# Patient Record
Sex: Female | Born: 1970 | Race: Asian | Hispanic: No | Marital: Married | Smoking: Never smoker
Health system: Southern US, Community
[De-identification: ages and names within clinical notes are randomized; demographics above are authoritative.]

---

## 2013-11-16 ENCOUNTER — Ambulatory Visit: Payer: Self-pay | Admitting: Internal Medicine

## 2014-06-26 ENCOUNTER — Emergency Department: Payer: Self-pay | Admitting: Emergency Medicine

## 2014-06-26 LAB — CBC WITH DIFFERENTIAL/PLATELET
BASOS ABS: 0 10*3/uL (ref 0.0–0.1)
Basophil %: 0.3 %
EOS PCT: 0.3 %
Eosinophil #: 0 10*3/uL (ref 0.0–0.7)
HCT: 43.6 % (ref 35.0–47.0)
HGB: 14.2 g/dL (ref 12.0–16.0)
Lymphocyte #: 1 10*3/uL (ref 1.0–3.6)
Lymphocyte %: 7.7 %
MCH: 30.6 pg (ref 26.0–34.0)
MCHC: 32.6 g/dL (ref 32.0–36.0)
MCV: 94 fL (ref 80–100)
Monocyte #: 0.7 x10 3/mm (ref 0.2–0.9)
Monocyte %: 5 %
NEUTROS ABS: 11.5 10*3/uL — AB (ref 1.4–6.5)
NEUTROS PCT: 86.7 %
PLATELETS: 283 10*3/uL (ref 150–440)
RBC: 4.64 10*6/uL (ref 3.80–5.20)
RDW: 13.8 % (ref 11.5–14.5)
WBC: 13.3 10*3/uL — AB (ref 3.6–11.0)

## 2014-06-26 LAB — COMPREHENSIVE METABOLIC PANEL
Albumin: 3.2 g/dL — ABNORMAL LOW (ref 3.4–5.0)
Alkaline Phosphatase: 55 U/L
Anion Gap: 9 (ref 7–16)
BILIRUBIN TOTAL: 0.9 mg/dL (ref 0.2–1.0)
BUN: 7 mg/dL (ref 7–18)
CO2: 24 mmol/L (ref 21–32)
CREATININE: 0.55 mg/dL — AB (ref 0.60–1.30)
Calcium, Total: 8.5 mg/dL (ref 8.5–10.1)
Chloride: 103 mmol/L (ref 98–107)
EGFR (Non-African Amer.): >60
Glucose: 105 mg/dL — ABNORMAL HIGH (ref 65–99)
OSMOLALITY: 270 (ref 275–301)
Potassium: 4.2 mmol/L (ref 3.5–5.1)
SGOT(AST): 37 U/L (ref 15–37)
SGPT (ALT): 22 U/L
Sodium: 136 mmol/L (ref 136–145)
Total Protein: 7.8 g/dL (ref 6.4–8.2)

## 2014-06-26 LAB — URINALYSIS, COMPLETE
Bilirubin,UR: NEGATIVE
Glucose,UR: NEGATIVE mg/dL (ref 0–75)
LEUKOCYTE ESTERASE: NEGATIVE
NITRITE: NEGATIVE
PH: 7 (ref 4.5–8.0)
Protein: NEGATIVE
RBC,UR: 1 /HPF (ref 0–5)
SPECIFIC GRAVITY: 1.004 (ref 1.003–1.030)
Squamous Epithelial: <1
WBC UR: 3 /HPF (ref 0–5)

## 2014-06-26 LAB — LIPASE, BLOOD: Lipase: 66 U/L — ABNORMAL LOW (ref 73–393)

## 2014-11-17 ENCOUNTER — Ambulatory Visit: Payer: Self-pay | Admitting: Internal Medicine

## 2016-05-22 ENCOUNTER — Other Ambulatory Visit: Payer: Self-pay | Admitting: Internal Medicine

## 2016-05-22 DIAGNOSIS — Z1231 Encounter for screening mammogram for malignant neoplasm of breast: Secondary | ICD-10-CM

## 2016-05-22 DIAGNOSIS — E559 Vitamin D deficiency, unspecified: Secondary | ICD-10-CM | POA: Insufficient documentation

## 2016-05-22 DIAGNOSIS — E039 Hypothyroidism, unspecified: Secondary | ICD-10-CM | POA: Insufficient documentation

## 2016-07-05 DIAGNOSIS — M722 Plantar fascial fibromatosis: Secondary | ICD-10-CM | POA: Insufficient documentation

## 2016-07-05 DIAGNOSIS — E782 Mixed hyperlipidemia: Secondary | ICD-10-CM | POA: Insufficient documentation

## 2016-08-13 ENCOUNTER — Encounter: Payer: Self-pay | Admitting: Radiology

## 2016-08-13 ENCOUNTER — Ambulatory Visit
Admission: RE | Admit: 2016-08-13 | Discharge: 2016-08-13 | Disposition: A | Discharge status: Home / Self Care | Payer: BLUE CROSS/BLUE SHIELD | Source: Ambulatory Visit | Attending: Internal Medicine | Admitting: Internal Medicine

## 2016-08-13 DIAGNOSIS — Z1231 Encounter for screening mammogram for malignant neoplasm of breast: Secondary | ICD-10-CM | POA: Insufficient documentation

## 2017-02-04 ENCOUNTER — Other Ambulatory Visit: Payer: Self-pay

## 2017-02-04 DIAGNOSIS — E782 Mixed hyperlipidemia: Secondary | ICD-10-CM

## 2017-02-04 DIAGNOSIS — E039 Hypothyroidism, unspecified: Secondary | ICD-10-CM

## 2017-02-05 LAB — LIPID PANEL
Chol/HDL Ratio: 5.2 ratio — ABNORMAL HIGH (ref 0.0–4.4)
Cholesterol, Total: 219 mg/dL — ABNORMAL HIGH (ref 100–199)
HDL: 42 mg/dL (ref >39)
LDL Calculated: 127 mg/dL — ABNORMAL HIGH (ref 0–99)
TRIGLYCERIDES: 248 mg/dL — AB (ref 0–149)
VLDL Cholesterol Cal: 50 mg/dL — ABNORMAL HIGH (ref 5–40)

## 2017-02-05 LAB — TSH: TSH: 5.08 u[IU]/mL — ABNORMAL HIGH (ref 0.450–4.500)

## 2017-02-08 DIAGNOSIS — R7989 Other specified abnormal findings of blood chemistry: Secondary | ICD-10-CM | POA: Insufficient documentation

## 2017-05-06 ENCOUNTER — Other Ambulatory Visit: Payer: Self-pay | Admitting: Internal Medicine

## 2017-05-06 DIAGNOSIS — Z1231 Encounter for screening mammogram for malignant neoplasm of breast: Secondary | ICD-10-CM

## 2017-08-07 ENCOUNTER — Other Ambulatory Visit: Payer: Self-pay

## 2017-08-07 DIAGNOSIS — E782 Mixed hyperlipidemia: Secondary | ICD-10-CM

## 2017-08-07 DIAGNOSIS — R7989 Other specified abnormal findings of blood chemistry: Secondary | ICD-10-CM

## 2017-08-07 DIAGNOSIS — E559 Vitamin D deficiency, unspecified: Secondary | ICD-10-CM

## 2017-08-08 LAB — LIPID PANEL
CHOLESTEROL TOTAL: 248 mg/dL — AB (ref 100–199)
Chol/HDL Ratio: 5.2 ratio — ABNORMAL HIGH (ref 0.0–4.4)
HDL: 48 mg/dL (ref >39)
LDL CALC: 162 mg/dL — AB (ref 0–99)
TRIGLYCERIDES: 189 mg/dL — AB (ref 0–149)
VLDL Cholesterol Cal: 38 mg/dL (ref 5–40)

## 2017-08-08 LAB — TSH: TSH: 3.45 u[IU]/mL (ref 0.450–4.500)

## 2017-08-08 LAB — VITAMIN D 25 HYDROXY (VIT D DEFICIENCY, FRACTURES): VIT D 25 HYDROXY: 20 ng/mL — AB (ref 30.0–100.0)

## 2017-08-14 ENCOUNTER — Ambulatory Visit
Admission: RE | Admit: 2017-08-14 | Discharge: 2017-08-14 | Disposition: A | Discharge status: Home / Self Care | Payer: BLUE CROSS/BLUE SHIELD | Source: Ambulatory Visit | Attending: Internal Medicine | Admitting: Internal Medicine

## 2017-08-14 DIAGNOSIS — Z1231 Encounter for screening mammogram for malignant neoplasm of breast: Secondary | ICD-10-CM | POA: Insufficient documentation

## 2018-01-13 ENCOUNTER — Telehealth: Payer: Self-pay | Admitting: *Deleted

## 2018-01-13 ENCOUNTER — Ambulatory Visit: Payer: Self-pay | Admitting: Medical

## 2018-01-13 VITALS — BP 128/60 | HR 115 | Temp 103.1°F | Resp 18 | Ht 60.0 in | Wt 146.8 lb

## 2018-01-13 DIAGNOSIS — J111 Influenza due to unidentified influenza virus with other respiratory manifestations: Secondary | ICD-10-CM

## 2018-01-13 DIAGNOSIS — J069 Acute upper respiratory infection, unspecified: Secondary | ICD-10-CM

## 2018-01-13 DIAGNOSIS — R509 Fever, unspecified: Secondary | ICD-10-CM

## 2018-01-13 LAB — POCT INFLUENZA A/B
INFLUENZA A, POC: NEGATIVE
INFLUENZA B, POC: NEGATIVE

## 2018-01-13 MED ORDER — AZITHROMYCIN 250 MG PO TABS: ORAL_TABLET | ORAL | 0 refills | Status: DC

## 2018-01-13 MED ORDER — OSELTAMIVIR PHOSPHATE 75 MG PO CAPS: 75.0000 mg | ORAL_CAPSULE | Freq: Two times a day (BID) | ORAL | 0 refills | Status: DC

## 2018-01-13 NOTE — Progress Notes (Signed)
   Subjective:    Patient ID: Kristine Flynn, female    DOB: April 25, 1971, 47 y.o.   MRN: [id number]  HPI 47 yo female presents in non acute distress with fever and not feeling well.   Started Friday  Mild sore throat, cough with  phelgm that is yelllow cough. And Body aches.  Has gargled a couple of times today which helped the throat.No shortness of breath or chest pain.  Blood pressure 128/60, pulse (!) 115, temperature (!) 103.1 F (39.5 C), temperature source Tympanic, resp. rate 18, height 5' (1.524 m), weight 146 lb 12.8 oz (66.6 kg), SpO2 98 %.  Review of Systems  Constitutional: Positive for appetite change (decreased appetite), fatigue and fever. Negative for chills.  HENT: Positive for congestion (mild), postnasal drip and sore throat. Negative for ear discharge, ear pain, sinus pressure, sinus pain and sneezing.   Respiratory: Positive for cough. Negative for shortness of breath and wheezing.   Cardiovascular: Negative for chest pain.  Gastrointestinal: Negative for abdominal pain.  Genitourinary: Negative for dysuria.  Musculoskeletal: Negative for myalgias.  Skin: Negative for rash.  Allergic/Immunologic: Negative for environmental allergies.       Objective:   Physical Exam  Constitutional: She appears well-developed and well-nourished.  HENT:  Head: Normocephalic and atraumatic.  Right Ear: External ear normal.  Eyes: Conjunctivae and EOM are normal. Pupils are equal, round, and reactive to light.  Neck: Normal range of motion. Neck supple.  Cardiovascular: Regular rhythm and normal heart sounds. Tachycardia present.  Pulmonary/Chest: Effort normal and breath sounds normal.  Musculoskeletal: Normal range of motion.  Neurological: She is alert.  Skin: Skin is warm, dry and intact.  Psychiatric: She has a normal mood and affect. Her speech is normal and behavior is normal. Judgment and thought content normal. Cognition and memory are normal.  Nursing note and vitals  reviewed.  Patient looks ill and tired. Given Tylenol 1 gram po [id number] exp 4/22 Inflenza test negative for A and B 102.3 at discharge patient feeling a bit better.    Assessment & Plan:  Ifluenza like Illness Upper Respiratory Infection. Meds ordered this encounter  Medications  . azithromycin (ZITHROMAX) 250 MG tablet    Sig: Take  2 tablets by mouth today then one tablet days 2-5 , take with food.    Dispense:  6 tablet    Refill:  0  . oseltamivir (TAMIFLU) 75 MG capsule    Sig: Take 1 capsule (75 mg total) by mouth 2 (two) times daily.    Dispense:  10 capsule    Refill:  0  Rest , increase fluids.water. Monitor and treat fever with OTC Motrin or Tylenol take as directed. Next dose of Tylenol 8pm Alternate Motrin and Tylenol for high fever. Return  To clinic in  3-5 days if not improving.  Recommended OTC Delsym or Robitussion , take as directed for cough. Patient verbalizes understanding an has no questions at discharge.

## 2018-01-13 NOTE — Patient Instructions (Signed)
Upper Respiratory Infection, Adult Most upper respiratory infections (URIs) are caused by a virus. A URI affects the nose, throat, and upper air passages. The most common type of URI is often called "the common cold." Follow these instructions at home:  Take medicines only as told by your doctor.  Gargle warm saltwater or take cough drops to comfort your throat as told by your doctor.  Use a warm mist humidifier or inhale steam from a shower to increase air moisture. This may make it easier to breathe.  Drink enough fluid to keep your pee (urine) clear or pale yellow.  Eat soups and other clear broths.  Have a healthy diet.  Rest as needed.  Go back to work when your fever is gone or your doctor says it is okay. ? You may need to stay home longer to avoid giving your URI to others. ? You can also wear a face mask and wash your hands often to prevent spread of the virus.  Use your inhaler more if you have asthma.  Do not use any tobacco products, including cigarettes, chewing tobacco, or electronic cigarettes. If you need help quitting, ask your doctor. Contact a doctor if:  You are getting worse, not better.  Your symptoms are not helped by medicine.  You have chills.  You are getting more short of breath.  You have brown or red mucus.  You have yellow or brown discharge from your nose.  You have pain in your face, especially when you bend forward.  You have a fever.  You have puffy (swollen) neck glands.  You have pain while swallowing.  You have white areas in the back of your throat. Get help right away if:  You have very bad or constant: ? Headache. ? Ear pain. ? Pain in your forehead, behind your eyes, and over your cheekbones (sinus pain). ? Chest pain.  You have long-lasting (chronic) lung disease and any of the following: ? Wheezing. ? Long-lasting cough. ? Coughing up blood. ? A change in your usual mucus.  You have a stiff neck.  You have  changes in your: ? Vision. ? Hearing. ? Thinking. ? Mood. This information is not intended to replace advice given to you by your health care provider. Make sure you discuss any questions you have with your health care provider. Document Released: 04/02/2008 Document Revised: 06/17/2016 Document Reviewed: 01/20/2014 Elsevier Interactive Patient Education  2018 [employer] Influenza, Adult Influenza ("the flu") is an infection in the lungs, nose, and throat (respiratory tract). It is caused by a virus. The flu causes many common cold symptoms, as well as a high fever and body aches. It can make you feel very sick. The flu spreads easily from person to person (is contagious). Getting a flu shot (influenza vaccination) every year is the best way to prevent the flu. Follow these instructions at home:  Take over-the-counter and prescription medicines only as told by your doctor.  Use a cool mist humidifier to add moisture (humidity) to the air in your home. This can make it easier to breathe.  Rest as needed.  Drink enough fluid to keep your pee (urine) clear or pale yellow.  Cover your mouth and nose when you cough or sneeze.  Wash your hands with soap and water often, especially after you cough or sneeze. If you cannot use soap and water, use hand sanitizer.  Stay home from work or school as told by your doctor. Unless you are visiting your  doctor, try to avoid leaving home until your fever has been gone for 24 hours without the use of medicine.  Keep all follow-up visits as told by your doctor. This is important. How is this prevented?  Getting a yearly (annual) flu shot is the best way to avoid getting the flu. You may get the flu shot in late summer, fall, or winter. Ask your doctor when you should get your flu shot.  Wash your hands often or use hand sanitizer often.  Avoid contact with people who are sick during cold and flu season.  Eat healthy foods.  Drink plenty of  fluids.  Get enough sleep.  Exercise regularly. Contact a doctor if:  You get new symptoms.  You have: ? Chest pain. ? Watery poop (diarrhea). ? A fever.  Your cough gets worse.  You start to have more mucus.  You feel sick to your stomach (nauseous).  You throw up (vomit). Get help right away if:  You start to be short of breath or have trouble breathing.  Your skin or nails turn a bluish color.  You have very bad pain or stiffness in your neck.  You get a sudden headache.  You get sudden pain in your face or ear.  You cannot stop throwing up. This information is not intended to replace advice given to you by your health care provider. Make sure you discuss any questions you have with your health care provider. Document Released: 07/24/2008 Document Revised: 03/22/2016 Document Reviewed: 08/09/2015 Elsevier Interactive Patient Education  2017 [employer]

## 2018-01-16 ENCOUNTER — Telehealth: Payer: Self-pay

## 2018-01-16 NOTE — Telephone Encounter (Signed)
Kristine Flynn Bibleat called the clinic because her temp had dropped to 97.5 which is below the normal of 98.7.  Reassured her that this is fine and is probably due to the tylenol that she has been taking her fever which has been as high as 103. Marland Kitchen. She has now stopped the Tylenol since she no longer has a fever.  Patient states she is feeling better and will call the clinic with any further concerns.

## 2018-01-17 ENCOUNTER — Ambulatory Visit
Admission: RE | Admit: 2018-01-17 | Discharge: 2018-01-17 | Disposition: A | Discharge status: Home / Self Care | Payer: BLUE CROSS/BLUE SHIELD | Source: Ambulatory Visit | Attending: Adult Health | Admitting: Adult Health

## 2018-01-17 ENCOUNTER — Telehealth: Payer: Self-pay | Admitting: Adult Health

## 2018-01-17 ENCOUNTER — Ambulatory Visit: Payer: Self-pay | Admitting: Adult Health

## 2018-01-17 ENCOUNTER — Encounter: Payer: Self-pay | Admitting: Adult Health

## 2018-01-17 VITALS — BP 128/66 | HR 84 | Temp 98.3°F | Resp 16 | Ht 60.0 in | Wt 145.0 lb

## 2018-01-17 DIAGNOSIS — J189 Pneumonia, unspecified organism: Secondary | ICD-10-CM

## 2018-01-17 DIAGNOSIS — R918 Other nonspecific abnormal finding of lung field: Secondary | ICD-10-CM | POA: Insufficient documentation

## 2018-01-17 DIAGNOSIS — R5383 Other fatigue: Secondary | ICD-10-CM | POA: Diagnosis present

## 2018-01-17 DIAGNOSIS — R059 Cough, unspecified: Secondary | ICD-10-CM

## 2018-01-17 DIAGNOSIS — R05 Cough: Secondary | ICD-10-CM

## 2018-01-17 MED ORDER — AMOXICILLIN-POT CLAVULANATE 875-125 MG PO TABS: 1.0000 | ORAL_TABLET | Freq: Two times a day (BID) | ORAL | 0 refills | Status: DC

## 2018-01-17 MED ORDER — AMOXICILLIN-POT CLAVULANATE ER 1000-62.5 MG PO TB12: 2.0000 | ORAL_TABLET | Freq: Two times a day (BID) | ORAL | 0 refills | Status: DC

## 2018-01-17 NOTE — Addendum Note (Signed)
Addended by: Berniece PapFLINCHUM, Lillee Mooneyhan S on: 01/17/2018 05:55 PM   Modules accepted: Orders

## 2018-01-17 NOTE — Patient Instructions (Signed)
Cough, Adult A cough helps to clear your throat and lungs. A cough may last only 2-3 weeks (acute), or it may last longer than 8 weeks (chronic). Many different things can cause a cough. A cough may be a sign of an illness or another medical condition. Follow these instructions at home:  Pay attention to any changes in your cough.  Take medicines only as told by your doctor. ? If you were prescribed an antibiotic medicine, take it as told by your doctor. Do not stop taking it even if you start to feel better. ? Talk with your doctor before you try using a cough medicine.  Drink enough fluid to keep your pee (urine) clear or pale yellow.  If the air is dry, use a cold steam vaporizer or humidifier in your home.  Stay away from things that make you cough at work or at home.  If your cough is worse at night, try using extra pillows to raise your head up higher while you sleep.  Do not smoke, and try not to be around smoke. If you need help quitting, ask your doctor.  Do not have caffeine.  Do not drink alcohol.  Rest as needed. Contact a doctor if:  You have new problems (symptoms).  You cough up yellow fluid (pus).  Your cough does not get better after 2-3 weeks, or your cough gets worse.  Medicine does not help your cough and you are not sleeping well.  You have pain that gets worse or pain that is not helped with medicine.  You have a fever.  You are losing weight and you do not know why.  You have night sweats. Get help right away if:  You cough up blood.  You have trouble breathing.  Your heartbeat is very fast. This information is not intended to replace advice given to you by your health care provider. Make sure you discuss any questions you have with your health care provider. Document Released: 06/28/2011 Document Revised: 03/22/2016 Document Reviewed: 12/22/2014 Elsevier Interactive Patient Education  2018 [employer] Influenza, Adult Influenza ("the  flu") is an infection in the lungs, nose, and throat (respiratory tract). It is caused by a virus. The flu causes many common cold symptoms, as well as a high fever and body aches. It can make you feel very sick. The flu spreads easily from person to person (is contagious). Getting a flu shot (influenza vaccination) every year is the best way to prevent the flu. Follow these instructions at home:  Take over-the-counter and prescription medicines only as told by your doctor.  Use a cool mist humidifier to add moisture (humidity) to the air in your home. This can make it easier to breathe.  Rest as needed.  Drink enough fluid to keep your pee (urine) clear or pale yellow.  Cover your mouth and nose when you cough or sneeze.  Wash your hands with soap and water often, especially after you cough or sneeze. If you cannot use soap and water, use hand sanitizer.  Stay home from work or school as told by your doctor. Unless you are visiting your doctor, try to avoid leaving home until your fever has been gone for 24 hours without the use of medicine.  Keep all follow-up visits as told by your doctor. This is important. How is this prevented?  Getting a yearly (annual) flu shot is the best way to avoid getting the flu. You may get the flu shot in late summer, fall,  or winter. Ask your doctor when you should get your flu shot.  Wash your hands often or use hand sanitizer often.  Avoid contact with people who are sick during cold and flu season.  Eat healthy foods.  Drink plenty of fluids.  Get enough sleep.  Exercise regularly. Contact a doctor if:  You get new symptoms.  You have: ? Chest pain. ? Watery poop (diarrhea). ? A fever.  Your cough gets worse.  You start to have more mucus.  You feel sick to your stomach (nauseous).  You throw up (vomit). Get help right away if:  You start to be short of breath or have trouble breathing.  Your skin or nails turn a bluish  color.  You have very bad pain or stiffness in your neck.  You get a sudden headache.  You get sudden pain in your face or ear.  You cannot stop throwing up. This information is not intended to replace advice given to you by your health care provider. Make sure you discuss any questions you have with your health care provider. Document Released: 07/24/2008 Document Revised: 03/22/2016 Document Reviewed: 08/09/2015 Elsevier Interactive Patient Education  2017 [employer].

## 2018-01-17 NOTE — Telephone Encounter (Addendum)
Patient called and reviewed CXR results below- given symptoms and clinical exam will treat as likely infection- Discussed results with supervising and Levaquin or Augmentin suggested at this time. Discontinue Azithromycin. She prefers Augmentin over Levaquin due to black box warning of tendon rupture. Discussed other possible differentials and waring signs to return to the emergency room or Call 911 if any symptoms change or worsen.   RDW elevated and she will follow up with PCP for this. CBC And CMET Stat otherwise normal.   Meds ordered this encounter  Medications  . DISCONTD: amoxicillin-clavulanate (AUGMENTIN) 875-125 MG tablet    Sig: Take 1 tablet by mouth 2 (two) times daily.    Dispense:  20 tablet    Refill:  0  . amoxicillin-clavulanate (AUGMENTIN XR) 1000-62.5 MG 12 hr tablet    Sig: Take 2 tablets by mouth 2 (two) times daily.    Dispense:  20 tablet    Refill:  0    After discussing with Dr. Sullivan Lone will treat with High dose Augmentin x 5 days. Also discussed with Kathlene November at [facility] [address] and verified high dose Augmentin per guidelines as above.   Orders Placed This Encounter  Procedures  . DG Chest 2 View    Standing Status:   Future    Standing Expiration Date:   03/19/2018    Order Specific Question:   Reason for Exam (SYMPTOM  OR DIAGNOSIS REQUIRED)    Answer:   follow up possibly ateletasis/ early infection/ treated as pneumonia    Order Specific Question:   Is patient pregnant?    Answer:   Unknown (Please Explain)    Comments:   please question patients     Order Specific Question:   Preferred imaging location?    Answer:   [facility]    Order Specific Question:   Call Results- Best Contact Number?    Answer:   [phone]    Order Specific Question:   Radiology Contrast Protocol - do NOT remove file path    Answer:   [email or web address]        DG Chest 2 View [[id number]] Resulted: 01/17/18 1611   Order Status: Completed Updated: 01/17/18 1613  Narrative:   CLINICAL DATA: Fluid 1 week ago on antibiotics with persistent productive cough and fatigue.  EXAM: CHEST - 2 VIEW  COMPARISON: None.  FINDINGS: Lungs are adequately inflated with possible mild opacification over the region of the right middle lobe/lingula on the lateral film. No effusion. Cardiomediastinal silhouette and remainder of the exam is within normal.  IMPRESSION: Mild hazy airspace density in the region of the right middle lobe/lingula on the lateral film which may be due to atelectasis or early infection.   Electronically Signed By: Elberta Fortis M.D. On: 01/17/2018 16:11   Advised patient call the office or your primary care doctor for an appointment if no improvement within 72 hours or if any symptoms change or worsen at any time  Advised ER or urgent Care if after hours or on weekend. Call 911 for emergency symptoms at any time.Patinet verbalized understanding of all instructions given/reviewed and treatment plan and has no further questions or concerns at this time.    She will call the office on Monday 01/20/18 for office visit on Monday or Tuesday for recheck and knows to go for repeat chest x ray in one month for follow up - walk in at [facility] and order has been placed already   Provider thoroughly discussed in collaboration above  plan with supervising physician Dr. Julieanne Mansonichard Gilbert who is in agreement with the care plan as above.   Results for orders placed or performed in visit on 01/17/18 (from the past 24 hour(s))  CBC w/Diff     Status: Abnormal   Collection Time: 01/17/18  2:58 PM  Result Value Ref Range   WBC 5.3 3.4 - 10.8 x10E3/uL   RBC 4.87 3.77 - 5.28 x10E6/uL   Hemoglobin 13.2 11.1 - 15.9 g/dL   Hematocrit 16.139.7 09.634.0 - 46.6 %   MCV 82 79 - 97 fL   MCH 27.1 26.6 - 33.0 pg   MCHC 33.2 31.5 - 35.7 g/dL   RDW 04.519.4 (H) 40.912.3 - 81.115.4 %   Platelets 369 150 - 379 x10E3/uL    Neutrophils 56 Not Estab. %   Lymphs 35 Not Estab. %   Monocytes 6 Not Estab. %   Eos 3 Not Estab. %   Basos 0 Not Estab. %   Neutrophils Absolute 3.0 1.4 - 7.0 x10E3/uL   Lymphocytes Absolute 1.8 0.7 - 3.1 x10E3/uL   Monocytes Absolute 0.3 0.1 - 0.9 x10E3/uL   EOS (ABSOLUTE) 0.1 0.0 - 0.4 x10E3/uL   Basophils Absolute 0.0 0.0 - 0.2 x10E3/uL   Immature Granulocytes 0 Not Estab. %   Immature Grans (Abs) 0.0 0.0 - 0.1 x10E3/uL   Narrative   Performed at:  [address] - [facility] [address], [place], [place]  [id number] Lab Director: Jolene SchimkeSanjai Nagendra MD, Phone:  [phone]  Comprehensive metabolic panel     Status: None   Collection Time: 01/17/18  2:58 PM  Result Value Ref Range   Glucose 94 65 - 99 mg/dL   BUN 13 6 - 24 mg/dL   Creatinine, Ser 6.960.66 0.57 - 1.00 mg/dL   GFR calc non Af Amer 106 >59 mL/min/1.73   GFR calc Af Amer 122 >59 mL/min/1.73   BUN/Creatinine Ratio 20 9 - 23   Sodium 138 134 - 144 mmol/L   Potassium 4.5 3.5 - 5.2 mmol/L   Chloride 102 96 - 106 mmol/L   CO2 24 20 - 29 mmol/L   Calcium 9.2 8.7 - 10.2 mg/dL   Total Protein 7.1 6.0 - 8.5 g/dL   Albumin 4.3 3.5 - 5.5 g/dL   Globulin, Total 2.8 1.5 - 4.5 g/dL   Albumin/Globulin Ratio 1.5 1.2 - 2.2   Bilirubin Total <0.2 0.0 - 1.2 mg/dL   Alkaline Phosphatase 39 39 - 117 IU/L   AST 22 0 - 40 IU/L   ALT 27 0 - 32 IU/L   Narrative   Performed at:  [address] - [facility] [place] [address], [place], [place]  [id number] Lab Director: Jolene SchimkeSanjai Nagendra MD, Phone:  [phone]

## 2018-01-17 NOTE — Progress Notes (Signed)
See note telephone call 01/17/18. Reviewed.

## 2018-01-17 NOTE — Telephone Encounter (Signed)
Patient called 01/17/18 and reports last night she slept 6 hours without coughing and this is much improved. She still has mild chest congestion.Denies wheezing or stridor. Speaks in clear full sentences on phone. NO audible coughing or wheezing.   She is on Azithromycin and is concerned she is taking the last dose today and wonders if she needs more called in. Energy is improved. She has not gotten much   She reports she has been afebrile since 01/14/18. She reports her symptoms are improved though she has had some mild shortness of breath with walking fast with her child today, this has since resolved.   Patient  denies any fever, body aches,chills, rash, chest pain, shortness of breath currently, nausea, vomiting, or diarrhea.   Offered office visit recheck today at 2:30 patient will come today.

## 2018-01-17 NOTE — Progress Notes (Signed)
Subjective:     Patient ID: Kristine Flynn, female   DOB: 04-Aug-1971, 47 y.o.   MRN: [id number]  HPI   Patient is a 47 year old female in no acute distress who presents to clinic in no acute distress who presents to the clinic after speaking with provider via phone earlier and being advise to come in for a recheck office visit with complaints as below. She was diagnosed with flu like symptoms on Monday- Flu A and B were negative by POCT. She is on her last dose of Azithromycin today she reports.  She reports she was in a hurry to sons pediatric appointment and she had shortness of breath earlier today that resolved with rest.  She denies any shortness of breath currently.  She reports  That on Wednesday 20th her fever resolved she reports she just stopped Tylenol yesterday.  She had called in and spoke with nursing two days ago regarding temperature 97.7. She reportts she stopped over the   Patient  denies any fever, body aches,chills, rash, chest pain, shortness of breath, nausea, vomiting, or diarrhea.   She denies any other medical problems besides below listed.  No LMP recorded. Thursday March 7th LMP- denies any chance of pregnancy currently. She reprts regular monthly cycles.   Patient Active Problem List   Diagnosis Date Noted  . Elevated TSH 02/08/2017  . 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 Mixed hyperlipidemia 07/05/2016  . Acquired hypothyroidism 05/22/2016  . Vitamin D deficiency, unspecified 05/22/2016    Current Outpatient Medications:  .  azithromycin (ZITHROMAX) 250 MG tablet, Take  2 tablets by mouth today then one tablet days 2-5 , take with food., Disp: 6 tablet, Rfl: 0 .  Homeopathic Products (ZICAM COLD REMEDY PO), Take by mouth., Disp: , Rfl:  .  oseltamivir (TAMIFLU) 75 MG capsule, Take 1 capsule (75 mg total) by mouth 2 (two) times daily., Disp: 10 capsule, Rfl: 0  Review of Systems  Constitutional: Positive for activity change, appetite change and fatigue  (" patinet states she is tired, though has small kids and not resting she reports). Negative for chills, diaphoresis and fever (stopped Tylenol yesterday/ no fever in office today ).  HENT: Positive for congestion (chest congestion only). Negative for dental problem, drooling, ear discharge, ear pain, facial swelling, hearing loss, mouth sores, nosebleeds, postnasal drip, rhinorrhea, sinus pressure, sinus pain, sneezing, sore throat, tinnitus, trouble swallowing and voice change.   Respiratory: Positive for cough and shortness of breath (x 1 episode while walking her son into pediatric office today/ she reprts normally walks the dog without any difficulty. ). Negative for apnea, choking, chest tightness, wheezing and stridor.   Cardiovascular: Negative for chest pain, palpitations and leg swelling.  Gastrointestinal: Negative.   Endocrine: Negative.   Genitourinary: Negative.   Musculoskeletal: Negative.   Skin: Negative.   Neurological: Negative.   Hematological: Negative.   Psychiatric/Behavioral: Negative.        Objective:   Physical Exam  Constitutional: Vital signs are normal. She appears well-developed and well-nourished. She is active.  Non-toxic appearance. She does not have a sickly appearance. She does not appear ill. No distress. She is not intubated.  Appears fatigued  HENT:  Head: Normocephalic and atraumatic.  Right Ear: Hearing, tympanic membrane, external ear and ear canal normal.  Left Ear: Hearing, tympanic membrane, external ear and ear canal normal.  Nose: Nose normal. No mucosal edema or rhinorrhea. Right sinus exhibits no maxillary sinus tenderness and no frontal sinus  tenderness. Left sinus exhibits no maxillary sinus tenderness and no frontal sinus tenderness.  Eyes: Pupils are equal, round, and reactive to light. Conjunctivae, EOM and lids are normal.  Neck: Trachea normal, normal range of motion, full passive range of motion without pain and phonation normal. Neck  supple. Normal carotid pulses, no hepatojugular reflux and no JVD present. No tracheal tenderness, no spinous process tenderness and no muscular tenderness present. Carotid bruit is not present. No neck rigidity. No tracheal deviation, no edema, no erythema and normal range of motion present. No Brudzinski's sign noted.  Cardiovascular: Normal rate, regular rhythm, S2 normal, normal heart sounds and normal pulses. Exam reveals no gallop, no distant heart sounds and no friction rub.  No murmur heard. Pulmonary/Chest: Effort normal. No accessory muscle usage or stridor. No apnea, no tachypnea and no bradypnea. She is not intubated. No respiratory distress. She has no decreased breath sounds. She has no wheezes. She has no rhonchi. She has rales in the right middle field and the right lower field.  Mild scattered crackles auscultated in right middle lobe and in upper portion of right lower lobe.  Bronchial congestion audible by ear in room.  No wheezing or stridor.   No other adventitious sounds heard.   No pain with palpation of chest/ ribs bilaterally.   Abdominal: Soft. Normal appearance and normal aorta. She exhibits no distension and no pulsatile midline mass. There is no CVA tenderness.  Musculoskeletal: Normal range of motion.  Lymphadenopathy:       Head (right side): No submental, no submandibular, no tonsillar, no preauricular, no posterior auricular and no occipital adenopathy present.       Head (left side): No submental, no submandibular, no tonsillar, no preauricular, no posterior auricular and no occipital adenopathy present.    She has no cervical adenopathy.  Neurological: She is alert.  Patient moves on and off of exam table and in room without difficulty. Gait is normal in hall and in room. Patient is oriented to person place time and situation. Patient answers questions appropriately and engages in conversation.  Patient speaks in clear audible full sentences without any  distress.   Skin: Skin is warm, dry and intact. No rash noted. She is not diaphoretic. No cyanosis. No pallor. Nails show no clubbing.  Psychiatric: She has a normal mood and affect. Her speech is normal and behavior is normal. Judgment and thought content normal. Cognition and memory are normal.  Vitals reviewed.      Assessment:     Cough  Fatigue, unspecified type - Plan: DG Chest 2 View, CBC w/Diff, Comprehensive metabolic panel      Plan:     Orders Placed This Encounter  Procedures  . DG Chest 2 View    Standing Status:   Future    Number of Occurrences:   1    Standing Expiration Date:   03/20/2019    Order Specific Question:   Reason for Exam (SYMPTOM  OR DIAGNOSIS REQUIRED)    Answer:   mild shortness of breath - flu last week on azithromycin rule out pneumonia or other etiology.    Order Specific Question:   Is patient pregnant?    Answer:   No    Comments:   January 02 2018      Order Specific Question:   Preferred imaging location?    Answer:   [facility]    Order Specific Question:   Call Results- Best Contact Number?    Answer:  [phone]    Order Specific Question:   Radiology Contrast Protocol - do NOT remove file path    Answer:   [email or web address]  . CBC w/Diff  . Comprehensive metabolic panel   Advised patient call the office or your primary care doctor for an appointment if no improvement within 72 hours or if any symptoms change or worsen at any time  Advised ER or urgent Care if after hours or on weekend. Call 911 for emergency symptoms at any time.Patinet verbalized understanding of all instructions given/reviewed and treatment plan and has no further questions or concerns at this time.       Provider thoroughly discussed in collaboration above plan with supervising physician Dr. Julieanne Manson who is in agreement with the care plan as above.

## 2018-01-19 ENCOUNTER — Telehealth: Payer: Self-pay | Admitting: Adult Health

## 2018-01-19 NOTE — Telephone Encounter (Signed)
01/19/18 Attempted to call patient for follow up phone call from office visit Friday 6:00 pm. No answer.

## 2018-01-20 LAB — CBC WITH DIFFERENTIAL/PLATELET
BASOS: 0 %
Basophils Absolute: 0 10*3/uL (ref 0.0–0.2)
EOS (ABSOLUTE): 0.1 10*3/uL (ref 0.0–0.4)
EOS: 3 %
HEMATOCRIT: 39.7 % (ref 34.0–46.6)
HEMOGLOBIN: 13.2 g/dL (ref 11.1–15.9)
IMMATURE GRANS (ABS): 0 10*3/uL (ref 0.0–0.1)
IMMATURE GRANULOCYTES: 0 %
LYMPHS: 35 %
Lymphocytes Absolute: 1.8 10*3/uL (ref 0.7–3.1)
MCH: 27.1 pg (ref 26.6–33.0)
MCHC: 33.2 g/dL (ref 31.5–35.7)
MCV: 82 fL (ref 79–97)
MONOCYTES: 6 %
Monocytes Absolute: 0.3 10*3/uL (ref 0.1–0.9)
NEUTROS PCT: 56 %
Neutrophils Absolute: 3 10*3/uL (ref 1.4–7.0)
Platelets: 369 10*3/uL (ref 150–379)
RBC: 4.87 x10E6/uL (ref 3.77–5.28)
RDW: 19.4 % — ABNORMAL HIGH (ref 12.3–15.4)
WBC: 5.3 10*3/uL (ref 3.4–10.8)

## 2018-01-20 LAB — COMPREHENSIVE METABOLIC PANEL
A/G RATIO: 1.5 (ref 1.2–2.2)
ALBUMIN: 4.3 g/dL (ref 3.5–5.5)
ALT: 27 IU/L (ref 0–32)
AST: 22 IU/L (ref 0–40)
Alkaline Phosphatase: 39 IU/L (ref 39–117)
BUN/Creatinine Ratio: 20 (ref 9–23)
BUN: 13 mg/dL (ref 6–24)
Bilirubin Total: <0.2 mg/dL (ref 0.0–1.2)
CALCIUM: 9.2 mg/dL (ref 8.7–10.2)
CO2: 24 mmol/L (ref 20–29)
Chloride: 102 mmol/L (ref 96–106)
Creatinine, Ser: 0.66 mg/dL (ref 0.57–1.00)
GFR, EST AFRICAN AMERICAN: 122 mL/min/{1.73_m2} (ref >59)
GFR, EST NON AFRICAN AMERICAN: 106 mL/min/{1.73_m2} (ref >59)
GLOBULIN, TOTAL: 2.8 g/dL (ref 1.5–4.5)
Glucose: 94 mg/dL (ref 65–99)
Potassium: 4.5 mmol/L (ref 3.5–5.2)
Sodium: 138 mmol/L (ref 134–144)
TOTAL PROTEIN: 7.1 g/dL (ref 6.0–8.5)

## 2018-01-21 ENCOUNTER — Ambulatory Visit: Payer: Self-pay | Admitting: Medical

## 2018-01-21 VITALS — BP 131/73 | HR 80 | Temp 98.0°F | Resp 16 | Ht 60.0 in | Wt 145.0 lb

## 2018-01-21 DIAGNOSIS — J189 Pneumonia, unspecified organism: Secondary | ICD-10-CM

## 2018-01-21 DIAGNOSIS — J181 Lobar pneumonia, unspecified organism: Principal | ICD-10-CM

## 2018-01-21 NOTE — Progress Notes (Signed)
Ty seen patient  3/26 see note.

## 2018-01-21 NOTE — Progress Notes (Signed)
Subjective:    Patient ID: Kristine Flynn, female    DOB: [birth year], 47 y.o.   MRN: [id number]  HPI  47 yo non acute distress. Seen initially 01/13/18  and treated for Flu.  Seen again in follow up 01/17/18  Treated with high dose Augmentin for  Pneumonia, comes in today for follow up. Denies fever or ( last set of chills one week ago) and denies shortness of breath or Chest pain.  Still feels fatigued but improving. Able to sleep  6 hours without coughing. Cougihng up some mucus light colored, "transparent yellow". Not feeling wheezing and chest congestion better. Still taking  Augmenting Bid. Had diarrhea on Saturday 2 x and now bowel movements are back to normal. Eating well per patient. Returned to work yesterday, sitting mostly catching up on work. Tired by the end of the day.   Blood pressure 131/73, pulse 80, temperature 98 F (36.7 C), temperature source Tympanic, resp. rate 16, height 5' (1.524 m), weight 145 lb (65.8 kg), last menstrual period 01/02/2018, SpO2 99 %.  Review of Systems  Constitutional: Positive for fatigue (improving). Negative for chills and fever.  HENT: Positive for congestion (yesterday a" litle bit"). Negative for ear discharge, ear pain, postnasal drip, rhinorrhea, sinus pressure, sinus pain, sneezing, sore throat, trouble swallowing and voice change.   Eyes: Negative for discharge and itching.  Respiratory: Positive for cough. Negative for chest tightness, shortness of breath and wheezing.   Cardiovascular: Negative for chest pain.  Gastrointestinal: Positive for diarrhea (on Saturday x 2 now back to normal BM). Negative for abdominal pain, nausea and vomiting.  Genitourinary: Negative for dysuria.  Musculoskeletal: Negative for myalgias.  Skin: Negative for rash.  Allergic/Immunologic: Negative for environmental allergies and food allergies.  Neurological: Negative for dizziness, syncope and light-headedness.  Hematological: Negative for adenopathy.   Psychiatric/Behavioral: Negative for behavioral problems, self-injury and suicidal ideas. The patient is not nervous/anxious.    requestioned patient on wheezing then she admited sometimes she hears wheezing.    Objective:   Physical Exam  Constitutional: She is oriented to person, place, and time. She appears well-developed and well-nourished.  HENT:  Head: Normocephalic and atraumatic.  Right Ear: External ear normal.  Left Ear: External ear normal.  Nose: Nose normal.  Mouth/Throat: Oropharynx is clear and moist.  Eyes: Pupils are equal, round, and reactive to light. Conjunctivae and EOM are normal.  Neck: Normal range of motion. Neck supple.  Cardiovascular: Normal rate, regular rhythm and normal heart sounds.  Pulmonary/Chest: Effort normal. She has wheezes (right mid).  Neurological: She is alert and oriented to person, place, and time.  Skin: Skin is warm and dry.  Psychiatric: She has a normal mood and affect. Her behavior is normal. Judgment and thought content normal.     Inspiratory wheeze note on mid right lung posterior. Looks much better then when I saw her on  01/13/18.    Assessment & Plan:  Pneumonia Finish antibiotics. Encouraged patient not to work to hard and to take care of herself with extra rest.  Declines Steroids and Albuterol inhaler. Recheck on  Friday due to wheezing and patient denying to get Steroids or Albuterol today, feels if she just rests she will improve.  Explain use of each medication.. Has a presentation due tomorrow, so trying to catch up on her work for the presentation and also end of the month work. Also has additional work on Thursday and Friday. Encouraged   Patient to call tomorrow if  feeling worse for Steroids and Albuterol Inhaler.she verbalizes understanding and has no questions at discharge. Gatorade given to patient prior to leaving.

## 2018-01-21 NOTE — Patient Instructions (Signed)

## 2018-01-24 ENCOUNTER — Encounter: Payer: Self-pay | Admitting: Adult Health

## 2018-01-24 ENCOUNTER — Ambulatory Visit: Payer: Self-pay | Admitting: Adult Health

## 2018-01-24 VITALS — BP 125/63 | HR 82 | Temp 99.3°F | Resp 16 | Ht 60.0 in | Wt 145.0 lb

## 2018-01-24 DIAGNOSIS — Z0000 Encounter for general adult medical examination without abnormal findings: Secondary | ICD-10-CM

## 2018-01-24 DIAGNOSIS — R059 Cough, unspecified: Secondary | ICD-10-CM | POA: Insufficient documentation

## 2018-01-24 DIAGNOSIS — J181 Lobar pneumonia, unspecified organism: Secondary | ICD-10-CM

## 2018-01-24 DIAGNOSIS — R918 Other nonspecific abnormal finding of lung field: Secondary | ICD-10-CM

## 2018-01-24 DIAGNOSIS — J189 Pneumonia, unspecified organism: Secondary | ICD-10-CM

## 2018-01-24 DIAGNOSIS — R05 Cough: Secondary | ICD-10-CM | POA: Insufficient documentation

## 2018-01-24 MED ORDER — AMOXICILLIN-POT CLAVULANATE ER 1000-62.5 MG PO TB12: 2.0000 | ORAL_TABLET | Freq: Two times a day (BID) | ORAL | 0 refills | Status: DC

## 2018-01-24 NOTE — Patient Instructions (Signed)
Amoxicillin; Clavulanic Acid extended-release tablets What is this medicine? AMOXICILLIN; CLAVULANIC ACID (a mox i SILL in; KLAV yoo lan ic AS id) is a penicillin antibiotic. It is used to treat certain kinds of bacterial infections. It will not work for colds, flu, or other viral infections. This medicine may be used for other purposes; ask your health care provider or pharmacist if you have questions. COMMON BRAND NAME(S): Augmentin XR What should I tell my health care provider before I take this medicine? They need to know if you have any of these conditions: -bowel disease, like colitis -kidney disease -liver disease -mononucleosis -an unusual or allergic reaction to amoxicillin, penicillin, cephalosporin, other antibiotics, clavulanic acid, other medicines, foods, dyes, or preservatives -pregnant or trying to get pregnant -breast-feeding How should I use this medicine? Take this medicine by mouth with a full glass of water. Follow the directions on the prescription label. Take at the start of a meal. Do not crush or chew. You may cut this medicine in half at the score line for easier swallowing. Take your medicine at regular intervals. Do not take your medicine more often than directed. Take all of your medicine as directed even if you think you are better. Do not skip doses or stop your medicine early. Contact your pediatrician or health care professional regarding the use of this medicine in children. This medicine has been used in children as young as 46 years of age. Overdosage: If you think you have taken too much of this medicine contact a poison control center or emergency room at once. NOTE: This medicine is only for you. Do not share this medicine with others. What if I miss a dose? If you miss a dose, take it as soon as you can. If it is almost time for your next dose, take only that dose. Do not take double or extra doses. What may interact with this  medicine? -allopurinol -anticoagulants -birth control pills -methotrexate -probenecid This list may not describe all possible interactions. Give your health care provider a list of all the medicines, herbs, non-prescription drugs, or dietary supplements you use. Also tell them if you smoke, drink alcohol, or use illegal drugs. Some items may interact with your medicine. What should I watch for while using this medicine? Tell your doctor or health care professional if your symptoms do not improve. Do not treat diarrhea with over the counter products. Contact your doctor if you have diarrhea that lasts more than 2 days or if it is severe and watery. If you have diabetes, you may get a false-positive result for sugar in your urine. Check with your doctor or health care professional. Birth control pills may not work properly while you are taking this medicine. Talk to your doctor about using an extra method of birth control. What side effects may I notice from receiving this medicine? Side effects that you should report to your doctor or health care professional as soon as possible: -allergic reactions like skin rash, itching or hives, swelling of the face, lips, or tongue -breathing problems -dark urine -fever or chills, sore throat -redness, blistering, peeling or loosening of the skin, including inside the mouth -seizures -trouble passing urine or change in the amount of urine -unusual bleeding, bruising -unusually weak or tired -white patches or sores in the mouth or throat Side effects that usually do not require medical attention (report to your doctor or health care professional if they continue or are bothersome): -diarrhea -dizziness -headache -nausea, vomiting -stomach  upset -vaginal or anal irritation This list may not describe all possible side effects. Call your doctor for medical advice about side effects. You may report side effects to FDA at 1-800-FDA-1088. Where should I  keep my medicine? Keep out of the reach of children. Store at room temperature below 25 degrees C (77 degrees F). Keep container tightly closed. Throw away any unused medicine after the expiration date. NOTE: This sheet is a summary. It may not cover all possible information. If you have questions about this medicine, talk to your doctor, pharmacist, or health care provider.  2018 Elsevier/Gold Standard (2008-01-06 14:32:45) Community-Acquired Pneumonia, Adult Pneumonia is an infection of the lungs. One type of pneumonia can happen while a person is in a hospital. A different type can happen when a person is not in a hospital (community-acquired pneumonia). It is easy for this kind to spread from person to person. It can spread to you if you breathe near an infected person who coughs or sneezes. Some symptoms include:  A dry cough.  A wet (productive) cough.  Fever.  Sweating.  Chest pain.  Follow these instructions at home:  Take over-the-counter and prescription medicines only as told by your doctor. ? Only take cough medicine if you are losing sleep. ? If you were prescribed an antibiotic medicine, take it as told by your doctor. Do not stop taking the antibiotic even if you start to feel better.  Sleep with your head and neck raised (elevated). You can do this by putting a few pillows under your head, or you can sleep in a recliner.  Do not use tobacco products. These include cigarettes, chewing tobacco, and e-cigarettes. If you need help quitting, ask your doctor.  Drink enough water to keep your pee (urine) clear or pale yellow. A shot (vaccine) can help prevent pneumonia. Shots are often suggested for:  People older than 48 years of age.  People older than 48 years of age: ? Who are having cancer treatment. ? Who have long-term (chronic) lung disease. ? Who have problems with their body's defense system (immune system).  You may also prevent pneumonia if you take these  actions:  Get the flu (influenza) shot every year.  Go to the dentist as often as told.  Wash your hands often. If soap and water are not available, use hand sanitizer.  Contact a doctor if:  You have a fever.  You lose sleep because your cough medicine does not help. Get help right away if:  You are short of breath and it gets worse.  You have more chest pain.  Your sickness gets worse. This is very serious if: ? You are an older adult. ? Your body's defense system is weak.  You cough up blood. This information is not intended to replace advice given to you by your health care provider. Make sure you discuss any questions you have with your health care provider. Document Released: 04/02/2008 Document Revised: 03/22/2016 Document Reviewed: 02/09/2015 Elsevier Interactive Patient Education  [employer]2018 Elsevier Inc.

## 2018-01-24 NOTE — Progress Notes (Signed)
Subjective:     Patient ID: Kristine KrebsMelody K Flynn, female   DOB: 04/21/1971, 47 y.o.   MRN: [id number]  HPI    Blood pressure 125/63, pulse 82, temperature 99.3 F (37.4 C), resp. rate 16, height 5' (1.524 m), weight 145 lb (65.8 kg), last menstrual period 01/02/2018, SpO2 99 %. Recheck temperature by provider 100.0 tympanic.  Patient is a 47 year old female in no acute distress she reports she is feeling better, no longer having to take cough medication at night or during the day  and is having more energy than previous visit - she reports she has been back working x 2 days without any difficulty. Cough mild and intermittent. Denies any wheezing or any shortness of breath.   She has been on Augmentin XR since 01/21/18 and continues to take for total of five days.   She denies any fever at home - temp in office today is 99.3. -.  CBC on 01/17/18 was normal.  Patient  denies any fever, body aches, rash, chest pain, shortness of breath, nausea, vomiting, or diarrhea.    She reports she is tolerating the antibiotic well with no unwanted side effects. She is doing probiotics and yogurt.  Review of Systems  Constitutional: Positive for fatigue (improved but still less than normal energy ) and fever (in office today - denies at home ). Negative for activity change, appetite change, chills, diaphoresis and unexpected weight change.  HENT: Negative.   Respiratory: Positive for cough. Negative for apnea, choking, chest tightness, shortness of breath, wheezing and stridor.   Cardiovascular: Negative.   Gastrointestinal: Negative.   Endocrine: Negative.   Genitourinary: Negative.   Musculoskeletal: Negative.   Neurological: Negative.   Hematological: Negative.   Psychiatric/Behavioral: Negative.        Objective:   Physical Exam  Constitutional: She is oriented to person, place, and time. She appears well-developed and well-nourished. No distress.  HENT:  Head: Normocephalic and atraumatic.  Nose:  Nose normal.  Mouth/Throat: Oropharynx is clear and moist. No oropharyngeal exudate.  Eyes: Pupils are equal, round, and reactive to light. Conjunctivae and EOM are normal. Right eye exhibits no discharge. Left eye exhibits no discharge. No scleral icterus.  Neck: Normal range of motion. Neck supple. No JVD present. No tracheal deviation present.  Cardiovascular: Normal rate, regular rhythm, normal heart sounds and intact distal pulses. Exam reveals no gallop and no friction rub.  No murmur heard. Pulmonary/Chest: Effort normal and breath sounds normal. No stridor. No respiratory distress. She has no wheezes. She has no rales. She exhibits no tenderness.  No adventitious lung sounds auscultated.  Patient speaks full sentences without any distress or shortness of breath.   Denies any pain.   Abdominal: Soft. Bowel sounds are normal.  Musculoskeletal: Normal range of motion.  Patient moves on and off of exam table and in room without difficulty. Gait is normal in hall and in room. Patient is oriented to person place time and situation. Patient answers questions appropriately and engages in conversation.   Lymphadenopathy:       Head (right side): No submental, no submandibular, no tonsillar, no preauricular, no posterior auricular and no occipital adenopathy present.       Head (left side): No submental, no submandibular, no tonsillar, no preauricular, no posterior auricular and no occipital adenopathy present.    She has no cervical adenopathy.    She has no axillary adenopathy.  Neurological: She is alert and oriented to person, place, and time.  She displays normal reflexes. No cranial nerve deficit. She exhibits normal muscle tone. Coordination normal.  Skin: Skin is warm and dry. No rash noted. She is not diaphoretic. No erythema. No pallor.  Psychiatric: She has a normal mood and affect. Her behavior is normal. Judgment and thought content normal.  Vitals reviewed.      Assessment:      Routine check-up - Plan: CBC w/Diff  Opacity of lung on imaging study  Pneumonia of right middle lobe due to infectious organism (HCC)  Cough      Plan:     Orders Placed This Encounter  Procedures  . CBC w/Diff      will extend Augmentin XR for 5 days for a total of 10 days since patient is mildly febrile in office and clinically improving.  Recheck next Wednesday.   Meds ordered this encounter  Medications  . amoxicillin-clavulanate (AUGMENTIN XR) 1000-62.5 MG 12 hr tablet    Sig: Take 2 tablets by mouth 2 (two) times daily.    Dispense:  20 tablet    Refill:  0   Provider thoroughly discussed in collaboration above plan with supervising physician Dr. Julieanne Manson who is in agreement with the care plan as above.  She is already aware of repeat x ray order standing for walk in at [facility] for one month of time of diagnosis and reports she will go for this.  Advised patient call the office or your primary care doctor for an appointment if no improvement within 72 hours or if any symptoms change or worsen at any time  Advised ER or urgent Care if after hours or on weekend. Call 911 for emergency symptoms at any time.Patinet verbalized understanding of all instructions given/reviewed and treatment plan and has no further questions or concerns at this time.    Patient verbalized understanding of all instructions given and denies any further questions at this time.

## 2018-01-25 LAB — CBC WITH DIFFERENTIAL/PLATELET
BASOS ABS: 0 10*3/uL (ref 0.0–0.2)
Basos: 0 %
EOS (ABSOLUTE): 0.1 10*3/uL (ref 0.0–0.4)
Eos: 2 %
HEMOGLOBIN: 12.8 g/dL (ref 11.1–15.9)
Hematocrit: 40.2 % (ref 34.0–46.6)
Immature Grans (Abs): 0 10*3/uL (ref 0.0–0.1)
Immature Granulocytes: 0 %
LYMPHS ABS: 2.1 10*3/uL (ref 0.7–3.1)
Lymphs: 30 %
MCH: 26.2 pg — AB (ref 26.6–33.0)
MCHC: 31.8 g/dL (ref 31.5–35.7)
MCV: 82 fL (ref 79–97)
MONOS ABS: 0.4 10*3/uL (ref 0.1–0.9)
Monocytes: 5 %
NEUTROS ABS: 4.4 10*3/uL (ref 1.4–7.0)
Neutrophils: 63 %
PLATELETS: 448 10*3/uL — AB (ref 150–379)
RBC: 4.89 x10E6/uL (ref 3.77–5.28)
RDW: 19.3 % — AB (ref 12.3–15.4)
WBC: 7 10*3/uL (ref 3.4–10.8)

## 2018-01-27 ENCOUNTER — Ambulatory Visit: Payer: Self-pay | Admitting: Medical

## 2018-01-27 VITALS — BP 127/69 | HR 85 | Temp 99.1°F | Resp 16 | Wt 144.6 lb

## 2018-01-27 DIAGNOSIS — R899 Unspecified abnormal finding in specimens from other organs, systems and tissues: Secondary | ICD-10-CM

## 2018-01-27 DIAGNOSIS — L270 Generalized skin eruption due to drugs and medicaments taken internally: Secondary | ICD-10-CM

## 2018-01-27 NOTE — Progress Notes (Signed)
   Subjective:    Patient ID: Kristine Flynn, female    DOB: 01-10-1971, 47 y.o.   MRN: [id number]  HPI  47 yo female in non acute distress. Started on Saturday woke up with rash  on Trunk and arms then started on her leg [facility]( Patient was on an additional 5 days of Augmentin XR due to Pneumonia)..   Patient stopped Augmentin, last dose was Saturday am. Treated ,with Prednisone , Hydorxizine and Zantac [facility]. Improving. No difficulty breathing or throat swelling. Patient states she feels much better than the last 24 hours. Overall patient is feeling much better from her Pneumonia.  Review of Systems  Constitutional: Negative for chills, fatigue and fever.  HENT: Negative for congestion, ear pain and sore throat.   Respiratory: Negative for cough, chest tightness and shortness of breath.   Cardiovascular: Negative for chest pain.  Gastrointestinal: Negative for abdominal pain.  Endocrine: Negative for polydipsia, polyphagia and polyuria.  Genitourinary: Negative for dysuria.  Musculoskeletal: Negative for myalgias.  Skin: Positive for rash.  Allergic/Immunologic: Negative for environmental allergies and food allergies.  Neurological: Negative for dizziness, syncope and light-headedness.  Hematological: Negative for adenopathy.       Objective:   Physical Exam  Constitutional: She is oriented to person, place, and time. She appears well-developed and well-nourished.  HENT:  Head: Normocephalic and atraumatic.  Eyes: Pupils are equal, round, and reactive to light. Conjunctivae and EOM are normal.  Cardiovascular: Normal rate, regular rhythm and normal heart sounds. Exam reveals no gallop and no friction rub.  No murmur heard. Pulmonary/Chest: Effort normal and breath sounds normal. No respiratory distress. She has no wheezes. She has no rales.  Musculoskeletal: Normal range of motion.  Neurological: She is alert and oriented to person, place,  and time.  Skin: Rash noted. There is erythema.  Psychiatric: She has a normal mood and affect. Her behavior is normal. Judgment and thought content normal.  Nursing note and vitals reviewed.  Patient smiling today and fatigue looks gone.   Maculopapular rash on abdomen macular on arms, upper thighs a little welted up.     Assessment & Plan:  Drug induced rash  From Augmentin XR Patient to stay on Steroids, Zantac, Hydroxyzine take as prescribed.. Can take Calamine lotion as directed.  One week follow up as needed for recheck or if any concerns, she says she has to check her schedule.  3 week chest x-ray orders in Epic. Patient verbalizes understanding and has no questions at discharge. Reviewed current labs with Dr. Sullivan LoneGilbert, recheck in 4 weeks.

## 2018-01-27 NOTE — Patient Instructions (Signed)
Drug Rash A drug rash is a change in the color or texture of the skin that is caused by a drug. It can develop minutes, hours, or days after the person takes the drug. What are the causes? This condition is usually caused by a drug allergy. It can also be caused by exposure to sunlight after taking a drug that makes the skin sensitive to light. Drugs that commonly cause rashes include:  Penicillin.  Antibiotic medicines.  Medicines that treat seizures.  Medicines that treat cancer (chemotherapy).  Aspirin and other nonsteroidal anti-inflammatory drugs (NSAIDs).  Injectable dyes that contain iodine.  Insulin.  What are the signs or symptoms? Symptoms of this condition include:  Redness.  Tiny bumps.  Peeling.  Itching.  Itchy welts (hives).  Swelling.  The rash may appear on a small area of skin or all over the body. How is this diagnosed? To diagnose the condition, your health care provider will do a physical exam. He or she may also order tests to find out which drug caused the rash. Tests to find the cause of a rash include:  Skin tests.  Blood tests.  Drug challenge. For this test, you stop taking all of the drugs that you do not need to take, and then you start taking them again by adding back one of the drugs at a time.  How is this treated? A drug rash may be treated with medicines, including:  Antihistamines. These may be given to relieve itching.  An NSAID. This may be given to reduce swelling and treat pain.  A steroid drug. This may be given to reduce swelling.  The rash usually goes away when the person stops taking the drug that caused it. Follow these instructions at home:  Take medicines only as directed by your health care provider.  Let all of your health care providers know about any drug reactions you have had in the past.  If you have hives, take a cool shower or use a cool compress to relieve itchiness. Contact a health care provider  if:  You have a fever.  Your rash is not going away.  Your rash gets worse.  Your rash comes back.  You have wheezing or coughing. Get help right away if:  You start to have breathing problems.  You start to have shortness of breath.  You face or throat starts to swell.  You have severe weakness with dizziness or fainting.  You have chest pain. This information is not intended to replace advice given to you by your health care provider. Make sure you discuss any questions you have with your health care provider. Document Released: 11/22/2004 Document Revised: 03/22/2016 Document Reviewed: 08/11/2014 [employer] Interactive Patient Education  2018 [employer]  

## 2018-02-05 ENCOUNTER — Other Ambulatory Visit: Payer: Self-pay

## 2018-02-05 DIAGNOSIS — R899 Unspecified abnormal finding in specimens from other organs, systems and tissues: Secondary | ICD-10-CM

## 2018-02-05 DIAGNOSIS — R7989 Other specified abnormal findings of blood chemistry: Secondary | ICD-10-CM

## 2018-02-05 DIAGNOSIS — Z0000 Encounter for general adult medical examination without abnormal findings: Secondary | ICD-10-CM

## 2018-02-05 DIAGNOSIS — E782 Mixed hyperlipidemia: Secondary | ICD-10-CM

## 2018-02-06 LAB — CBC WITH DIFFERENTIAL/PLATELET
BASOS ABS: 0 10*3/uL (ref 0.0–0.2)
Basos: 0 %
EOS (ABSOLUTE): 0.1 10*3/uL (ref 0.0–0.4)
Eos: 2 %
Hematocrit: 38.7 % (ref 34.0–46.6)
Hemoglobin: 12.2 g/dL (ref 11.1–15.9)
IMMATURE GRANULOCYTES: 0 %
Immature Grans (Abs): 0 10*3/uL (ref 0.0–0.1)
LYMPHS ABS: 1.9 10*3/uL (ref 0.7–3.1)
Lymphs: 39 %
MCH: 26.2 pg — ABNORMAL LOW (ref 26.6–33.0)
MCHC: 31.5 g/dL (ref 31.5–35.7)
MCV: 83 fL (ref 79–97)
MONOS ABS: 0.3 10*3/uL (ref 0.1–0.9)
Monocytes: 6 %
NEUTROS PCT: 53 %
Neutrophils Absolute: 2.6 10*3/uL (ref 1.4–7.0)
Platelets: 402 10*3/uL — ABNORMAL HIGH (ref 150–379)
RBC: 4.65 x10E6/uL (ref 3.77–5.28)
RDW: 19.4 % — AB (ref 12.3–15.4)
WBC: 5 10*3/uL (ref 3.4–10.8)

## 2018-02-06 LAB — LIPID PANEL
CHOL/HDL RATIO: 6.4 ratio — AB (ref 0.0–4.4)
Cholesterol, Total: 268 mg/dL — ABNORMAL HIGH (ref 100–199)
HDL: 42 mg/dL (ref >39)
LDL Calculated: 153 mg/dL — ABNORMAL HIGH (ref 0–99)
Triglycerides: 364 mg/dL — ABNORMAL HIGH (ref 0–149)
VLDL CHOLESTEROL CAL: 73 mg/dL — AB (ref 5–40)

## 2018-02-06 LAB — TSH: TSH: 3.91 u[IU]/mL (ref 0.450–4.500)

## 2018-02-19 ENCOUNTER — Telehealth: Payer: Self-pay

## 2018-06-06 ENCOUNTER — Ambulatory Visit: Payer: Self-pay | Admitting: Adult Health

## 2018-06-06 ENCOUNTER — Ambulatory Visit
Admission: RE | Admit: 2018-06-06 | Discharge: 2018-06-06 | Disposition: A | Discharge status: Home / Self Care | Payer: BLUE CROSS/BLUE SHIELD | Source: Ambulatory Visit | Attending: Adult Health | Admitting: Adult Health

## 2018-06-06 ENCOUNTER — Encounter: Payer: Self-pay | Admitting: Adult Health

## 2018-06-06 VITALS — BP 126/57 | HR 86 | Temp 99.2°F | Resp 16 | Ht 60.0 in | Wt 142.0 lb

## 2018-06-06 DIAGNOSIS — Z09 Encounter for follow-up examination after completed treatment for conditions other than malignant neoplasm: Secondary | ICD-10-CM

## 2018-06-06 DIAGNOSIS — M542 Cervicalgia: Secondary | ICD-10-CM | POA: Diagnosis present

## 2018-06-06 DIAGNOSIS — M25511 Pain in right shoulder: Secondary | ICD-10-CM

## 2018-06-06 DIAGNOSIS — R9389 Abnormal findings on diagnostic imaging of other specified body structures: Secondary | ICD-10-CM | POA: Insufficient documentation

## 2018-06-06 DIAGNOSIS — M47812 Spondylosis without myelopathy or radiculopathy, cervical region: Secondary | ICD-10-CM | POA: Diagnosis not present

## 2018-06-06 MED ORDER — CYCLOBENZAPRINE HCL 10 MG PO TABS: 10.0000 mg | ORAL_TABLET | Freq: Every day | ORAL | 0 refills | Status: DC

## 2018-06-06 NOTE — Patient Instructions (Signed)
Cyclobenzaprine tablets What is this medicine? CYCLOBENZAPRINE (sye kloe BEN za preen) is a muscle relaxer. It is used to treat muscle pain, spasms, and stiffness. This medicine may be used for other purposes; ask your health care provider or pharmacist if you have questions. COMMON BRAND NAME(S): Fexmid, Flexeril What should I tell my health care provider before I take this medicine? They need to know if you have any of these conditions: -heart disease, irregular heartbeat, or previous heart attack -liver disease -thyroid problem -an unusual or allergic reaction to cyclobenzaprine, tricyclic antidepressants, lactose, other medicines, foods, dyes, or preservatives -pregnant or trying to get pregnant -breast-feeding How should I use this medicine? Take this medicine by mouth with a glass of water. Follow the directions on the prescription label. If this medicine upsets your stomach, take it with food or milk. Take your medicine at regular intervals. Do not take it more often than directed. Talk to your pediatrician regarding the use of this medicine in children. Special care may be needed. Overdosage: If you think you have taken too much of this medicine contact a poison control center or emergency room at once. NOTE: This medicine is only for you. Do not share this medicine with others. What if I miss a dose? If you miss a dose, take it as soon as you can. If it is almost time for your next dose, take only that dose. Do not take double or extra doses. What may interact with this medicine? Do not take this medicine with any of the following medications: -certain medicines for fungal infections like fluconazole, itraconazole, ketoconazole, posaconazole, voriconazole -cisapride -dofetilide -dronedarone -halofantrine -levomethadyl -MAOIs like Carbex, Eldepryl, Marplan, Nardil, and Parnate -narcotic medicines for cough -pimozide -thioridazine -ziprasidone This medicine may also interact  with the following medications: -alcohol -antihistamines for allergy, cough and cold -certain medicines for anxiety or sleep -certain medicines for cancer -certain medicines for depression like amitriptyline, fluoxetine, sertraline -certain medicines for infection like alfuzosin, chloroquine, clarithromycin, levofloxacin, mefloquine, pentamidine, troleandomycin -certain medicines for irregular heart beat -certain medicines for seizures like phenobarbital, primidone -contrast dyes -general anesthetics like halothane, isoflurane, methoxyflurane, propofol -local anesthetics like lidocaine, pramoxine, tetracaine -medicines that relax muscles for surgery -narcotic medicines for pain -other medicines that prolong the QT interval (cause an abnormal heart rhythm) -phenothiazines like chlorpromazine, mesoridazine, prochlorperazine This list may not describe all possible interactions. Give your health care provider a list of all the medicines, herbs, non-prescription drugs, or dietary supplements you use. Also tell them if you smoke, drink alcohol, or use illegal drugs. Some items may interact with your medicine. What should I watch for while using this medicine? Tell your doctor or health care professional if your symptoms do not start to get better or if they get worse. You may get drowsy or dizzy. Do not drive, use machinery, or do anything that needs mental alertness until you know how this medicine affects you. Do not stand or sit up quickly, especially if you are an older patient. This reduces the risk of dizzy or fainting spells. Alcohol may interfere with the effect of this medicine. Avoid alcoholic drinks. If you are taking another medicine that also causes drowsiness, you may have more side effects. Give your health care provider a list of all medicines you use. Your doctor will tell you how much medicine to take. Do not take more medicine than directed. Call emergency for help if you have  problems breathing or unusual sleepiness. Your mouth may get dry. Chewing   sugarless gum or sucking hard candy, and drinking plenty of water may help. Contact your doctor if the problem does not go away or is severe. What side effects may I notice from receiving this medicine? Side effects that you should report to your doctor or health care professional as soon as possible: -allergic reactions like skin rash, itching or hives, swelling of the face, lips, or tongue -breathing problems -chest pain -fast, irregular heartbeat -hallucinations -seizures -unusually weak or tired Side effects that usually do not require medical attention (report to your doctor or health care professional if they continue or are bothersome): -headache -nausea, vomiting This list may not describe all possible side effects. Call your doctor for medical advice about side effects. You may report side effects to FDA at 1-800-FDA-1088. Where should I keep my medicine? Keep out of the reach of children. Store at room temperature between 15 and 30 degrees C (59 and 86 degrees F). Keep container tightly closed. Throw away any unused medicine after the expiration date. NOTE: This sheet is a summary. It may not cover all possible information. If you have questions about this medicine, talk to your doctor, pharmacist, or health care provider.  2018 Elsevier/Gold Standard (2015-07-26 12:05:46) Ibuprofen tablets and capsules What is this medicine? IBUPROFEN (eye BYOO proe fen) is a non-steroidal anti-inflammatory drug (NSAID). It is used for dental pain, fever, headaches or migraines, osteoarthritis, rheumatoid arthritis, or painful monthly periods. It can also relieve minor aches and pains caused by a cold, flu, or sore throat. This medicine may be used for other purposes; ask your health care provider or pharmacist if you have questions. COMMON BRAND NAME(S): Advil, Advil Junior Strength, Advil Migraine, Genpril, Ibren, IBU,  Midol, Midol Cramps and Body Aches, Motrin, Motrin IB, Motrin Junior Strength, Motrin Migraine Pain, Samson-8, Toxicology Saliva Collection What should I tell my health care provider before I take this medicine? They need to know if you have any of these conditions: -asthma -cigarette smoker -drink more than 3 alcohol containing drinks a day -heart disease or circulation problems such as heart failure or leg edema (fluid retention) -high blood pressure -kidney disease -liver disease -stomach bleeding or ulcers -an unusual or allergic reaction to ibuprofen, aspirin, other NSAIDS, other medicines, foods, dyes, or preservatives -pregnant or trying to get pregnant -breast-feeding How should I use this medicine? Take this medicine by mouth with a glass of water. Follow the directions on the prescription label. Take this medicine with food if your stomach gets upset. Try to not lie down for at least 10 minutes after you take the medicine. Take your medicine at regular intervals. Do not take your medicine more often than directed. A special MedGuide will be given to you by the pharmacist with each prescription and refill. Be sure to read this information carefully each time. Talk to your pediatrician regarding the use of this medicine in children. Special care may be needed. Overdosage: If you think you have taken too much of this medicine contact a poison control center or emergency room at once. NOTE: This medicine is only for you. Do not share this medicine with others. What if I miss a dose? If you miss a dose, take it as soon as you can. If it is almost time for your next dose, take only that dose. Do not take double or extra doses. What may interact with this medicine? Do not take this medicine with any of the following medications: -cidofovir -ketorolac -methotrexate -pemetrexed This medicine may  also interact with the following  medications: -alcohol -aspirin -diuretics -lithium -other drugs for inflammation like prednisone -warfarin This list may not describe all possible interactions. Give your health care provider a list of all the medicines, herbs, non-prescription drugs, or dietary supplements you use. Also tell them if you smoke, drink alcohol, or use illegal drugs. Some items may interact with your medicine. What should I watch for while using this medicine? Tell your doctor or healthcare professional if your symptoms do not start to get better or if they get worse. This medicine does not prevent heart attack or stroke. In fact, this medicine may increase the chance of a heart attack or stroke. The chance may increase with longer use of this medicine and in people who have heart disease. If you take aspirin to prevent heart attack or stroke, talk with your doctor or health care professional. Do not take other medicines that contain aspirin, ibuprofen, or naproxen with this medicine. Side effects such as stomach upset, nausea, or ulcers may be more likely to occur. Many medicines available without a prescription should not be taken with this medicine. This medicine can cause ulcers and bleeding in the stomach and intestines at any time during treatment. Ulcers and bleeding can happen without warning symptoms and can cause death. To reduce your risk, do not smoke cigarettes or drink alcohol while you are taking this medicine. You may get drowsy or dizzy. Do not drive, use machinery, or do anything that needs mental alertness until you know how this medicine affects you. Do not stand or sit up quickly, especially if you are an older patient. This reduces the risk of dizzy or fainting spells. This medicine can cause you to bleed more easily. Try to avoid damage to your teeth and gums when you brush or floss your teeth. This medicine may be used to treat migraines. If you take migraine medicines for 10 or more days a month,  your migraines may get worse. Keep a diary of headache days and medicine use. Contact your healthcare professional if your migraine attacks occur more frequently. What side effects may I notice from receiving this medicine? Side effects that you should report to your doctor or health care professional as soon as possible: -allergic reactions like skin rash, itching or hives, swelling of the face, lips, or tongue -severe stomach pain -signs and symptoms of bleeding such as bloody or black, tarry stools; red or dark-brown urine; spitting up blood or brown material that looks like coffee grounds; red spots on the skin; unusual bruising or bleeding from the eye, gums, or nose -signs and symptoms of a blood clot such as changes in vision; chest pain; severe, sudden headache; trouble speaking; sudden numbness or weakness of the face, arm, or leg -unexplained weight gain or swelling -unusually weak or tired -yellowing of eyes or skin Side effects that usually do not require medical attention (report to your doctor or health care professional if they continue or are bothersome): -bruising -diarrhea -dizziness, drowsiness -headache -nausea, vomiting This list may not describe all possible side effects. Call your doctor for medical advice about side effects. You may report side effects to FDA at 1-800-FDA-1088. Where should I keep my medicine? Keep out of the reach of children. Store at room temperature between 15 and 30 degrees C (59 and 86 degrees F). Keep container tightly closed. Throw away any unused medicine after the expiration date. NOTE: This sheet is a summary. It may not cover all possible information.  If you have questions about this medicine, talk to your doctor, pharmacist, or health care provider.  2018 Elsevier/Gold Standard (2013-06-16 10:48:02) Cervical Sprain A cervical sprain is a stretch or tear in one or more of the tough, cord-like tissues that connect bones (ligaments) in the  neck. Cervical sprains can range from mild to severe. Severe cervical sprains can cause the spinal bones (vertebrae) in the neck to be unstable. This can lead to spinal cord damage and can result in serious nervous system problems. The amount of time that it takes for a cervical sprain to get better depends on the cause and extent of the injury. Most cervical sprains heal in 4-6 weeks. What are the causes? Cervical sprains may be caused by an injury (trauma), such as from a motor vehicle accident, a fall, or sudden forward and backward whipping movement of the head and neck (whiplash injury). Mild cervical sprains may be caused by wear and tear over time, such as from poor posture, sitting in a chair that does not provide support, or looking up or down for long periods of time. What increases the risk? The following factors may make you more likely to develop this condition:  Participating in activities that have a high risk of trauma to the neck. These include contact sports, auto racing, gymnastics, and diving.  Taking risks when driving or riding in a motor vehicle, such as speeding.  Having osteoarthritis of the spine.  Having poor strength and flexibility of the neck.  A previous neck injury.  Having poor posture.  Spending a lot of time in certain positions that put stress on the neck, such as sitting at a computer for long periods of time.  What are the signs or symptoms? Symptoms of this condition include:  Pain, soreness, stiffness, tenderness, swelling, or a burning sensation in the front, back, or sides of the neck.  Sudden tightening of neck muscles that you cannot control (muscle spasms).  Pain in the shoulders or upper back.  Limited ability to move the neck.  Headache.  Dizziness.  Nausea.  Vomiting.  Weakness, numbness, or tingling in a hand or an arm.  Symptoms may develop right away after injury, or they may develop over a few days. In some cases, symptoms  may go away with treatment and return (recur) over time. How is this diagnosed? This condition may be diagnosed based on:  Your medical history.  Your symptoms.  Any recent injuries or known neck problems that you have, such as arthritis in the neck.  A physical exam.  Imaging tests, such as: ? X-rays. ? MRI. ? CT scan.  How is this treated? This condition is treated by resting and icing the injured area and doing physical therapy exercises. Depending on the severity of your condition, treatment may also include:  Keeping your neck in place (immobilized) for periods of time. This may be done using: ? A cervical collar. This supports your chin and the back of your head. ? A cervical traction device. This is a sling that holds up your head. This removes weight and pressure from your neck, and it may help to relieve pain.  Medicines that help to relieve pain and inflammation.  Medicines that help to relax your muscles (muscle relaxants).  Surgery. This is rare.  Follow these instructions at home: If you have a cervical collar:  Wear it as told by your health care provider. Do not remove the collar unless instructed by your health care provider.  Ask your health care provider before you make any adjustments to your collar.  If you have long hair, keep it outside of the collar.  Ask your health care provider if you can remove the collar for cleaning and bathing. If you are allowed to remove the collar for cleaning or bathing: ? Follow instructions from your health care provider about how to remove the collar safely. ? Clean the collar by wiping it with mild soap and water and drying it completely. ? If your collar has removable pads, remove them every 1-2 days and wash them by hand with soap and water. Let them air-dry completely before you put them back in the collar. ? Check your skin under the collar for irritation or sores. If you see any, tell your health care  provider. Managing pain, stiffness, and swelling  If directed, use a cervical traction device as told by your health care provider.  If directed, apply heat to the affected area before you do your physical therapy or as often as told by your health care provider. Use the heat source that your health care provider recommends, such as a moist heat pack or a heating pad. ? Place a towel between your skin and the heat source. ? Leave the heat on for 20-30 minutes. ? Remove the heat if your skin turns bright red. This is especially important if you are unable to feel pain, heat, or cold. You may have a greater risk of getting burned.  If directed, put ice on the affected area: ? Put ice in a plastic bag. ? Place a towel between your skin and the bag. ? Leave the ice on for 20 minutes, 2-3 times a day. Activity  Do not drive while wearing a cervical collar. If you do not have a cervical collar, ask your health care provider if it is safe to drive while your neck heals.  Do not drive or use heavy machinery while taking prescription pain medicine or muscle relaxants, unless your health care provider approves.  Do not lift anything that is heavier than 10 lb (4.5 kg) until your health care provider tells you that it is safe.  Rest as directed by your health care provider. Avoid positions and activities that make your symptoms worse. Ask your health care provider what activities are safe for you.  If physical therapy was prescribed, do exercises as told by your health care provider or physical therapist. General instructions  Take over-the-counter and prescription medicines only as told by your health care provider.  Do not use any products that contain nicotine or tobacco, such as cigarettes and e-cigarettes. These can delay healing. If you need help quitting, ask your health care provider.  Keep all follow-up visits as told by your health care provider or physical therapist. This is  important. How is this prevented? To prevent a cervical sprain from happening again:  Use and maintain good posture. Make any needed adjustments to your workstation to help you use good posture.  Exercise regularly as directed by your health care provider or physical therapist.  Avoid risky activities that may cause a cervical sprain.  Contact a health care provider if:  You have symptoms that get worse or do not get better after 2 weeks of treatment.  You have pain that gets worse or does not get better with medicine.  You develop new, unexplained symptoms.  You have sores or irritated skin on your neck from wearing your cervical collar. Get help right  away if:  You have severe pain.  You develop numbness, tingling, or weakness in any part of your body.  You cannot move a part of your body (you have paralysis).  You have neck pain along with: ? Severe dizziness. ? Headache. Summary  A cervical sprain is a stretch or tear in one or more of the tough, cord-like tissues that connect bones (ligaments) in the neck.  Cervical sprains may be caused by an injury (trauma), such as from a motor vehicle accident, a fall, or sudden forward and backward whipping movement of the head and neck (whiplash injury).  Symptoms may develop right away after injury, or they may develop over a few days.  This condition is treated by resting and icing the injured area and doing physical therapy exercises. This information is not intended to replace advice given to you by your health care provider. Make sure you discuss any questions you have with your health care provider. Document Released: 08/12/2007 Document Revised: 06/13/2016 Document Reviewed: 06/13/2016 Elsevier Interactive Patient Education  [employer].

## 2018-06-06 NOTE — Progress Notes (Addendum)
Subjective:     Patient ID: Kristine KrebsMelody K Flynn, female   DOB: 1971/02/05, 47 y.o.   MRN: [id number]   Blood pressure (!) 126/57, pulse 86, temperature 99.2 F (37.3 C), resp. rate 16, height 5' (1.524 m), weight 142 lb (64.4 kg), last menstrual period 05/22/2018, SpO2 98 %. Neck Pain   This is a new problem. The current episode started 1 to 4 weeks ago (July 23rd ). The problem has been gradually worsening. The pain is associated with nothing. The pain is present in the right side. The quality of the pain is described as aching. The pain is at a severity of 2/10 (" stiffness" ). The pain is mild. The pain is worse during the night (after activities ). Stiffness is present all day. Associated symptoms include numbness (mild right thumb intermittent - rare ) and tingling (right thumb ). Pertinent negatives include no chest pain, fever, headaches, leg pain, pain with swallowing, paresis, photophobia, syncope, trouble swallowing, visual change, weakness or weight loss. She has tried NSAIDs and bed rest (massage / Ibuprofen 1 tablet ) for the symptoms. The treatment provided no relief.    Patient is a 47 year old female who comes to the office in no distress who comes to the clinic for necl pain that started on July 23 rd, she denies any injury. She reports she has had massage. She reports she has had radiation of tingling into right shoulder to right thumb intermittently - she reports this rare and happens more in the morning when her neck feels tight and stiff.  She feels massage improved slightly.  She is at her desk often and is going to check into better ergonomics. She is a Dance movement psychotherapistpianist as well.   Patient  denies any fever, body aches,chills, rash, chest pain, shortness of breath, nausea, vomiting, or diarrhea.    Review of Systems  Constitutional: Negative for activity change, appetite change, chills, diaphoresis, fatigue, fever, unexpected weight change and weight loss.  HENT: Negative.  Negative for  trouble swallowing.   Eyes: Negative for photophobia.  Respiratory: Negative.   Cardiovascular: Negative.  Negative for chest pain, palpitations, leg swelling and syncope.  Gastrointestinal: Negative.   Endocrine: Negative.   Genitourinary: Negative.   Musculoskeletal: Positive for neck pain and neck stiffness. Negative for arthralgias, back pain, gait problem, joint swelling and myalgias.       Right shoulder pain   Skin: Negative.   Neurological: Positive for tingling (right thumb ) and numbness (mild right thumb intermittent - rare ). Negative for dizziness, tremors, seizures, syncope, facial asymmetry, speech difficulty, weakness, light-headedness and headaches.  Hematological: Negative.   Psychiatric/Behavioral: Negative.        Objective:   Physical Exam  Constitutional: She is oriented to person, place, and time. She appears well-developed and well-nourished. No distress.  HENT:  Head: Normocephalic and atraumatic.  Right Ear: External ear normal.  Left Ear: External ear normal.  Nose: Nose normal.  Mouth/Throat: Oropharynx is clear and moist. No oropharyngeal exudate.  Eyes: Pupils are equal, round, and reactive to light. Conjunctivae and EOM are normal.  Neck: Normal range of motion. Neck supple.  Cardiovascular: Normal rate, regular rhythm, normal heart sounds and intact distal pulses. Exam reveals no gallop and no friction rub.  No murmur heard. Pulmonary/Chest: Effort normal and breath sounds normal. No stridor. No respiratory distress. She has no wheezes. She has no rales. She exhibits no tenderness.  Abdominal: Soft. Bowel sounds are normal.  Musculoskeletal: Normal range  of motion. She exhibits no edema or deformity.       Right shoulder: She exhibits crepitus (mild crepitis felt occasionally with shoulder ROM). She exhibits normal range of motion, no tenderness, no bony tenderness, no swelling, no effusion, no deformity, no laceration, no pain, no spasm, normal pulse  and normal strength.       Right wrist: Normal.       Cervical back: She exhibits tenderness and spasm. She exhibits normal range of motion, no bony tenderness, no swelling, no edema, no deformity, no laceration, no pain and normal pulse.       Back:       Right upper arm: Normal.       Right forearm: Normal.       Right hand: She exhibits normal range of motion, no tenderness, no bony tenderness, normal two-point discrimination, normal capillary refill, no deformity, no laceration and no swelling. Normal sensation noted. Decreased sensation is not present in the ulnar distribution, is not present in the medial distribution and is not present in the radial distribution. Normal strength noted. She exhibits no finger abduction, no thumb/finger opposition and no wrist extension trouble.       Hands: Lymphadenopathy:       Head (right side): No submental, no submandibular, no tonsillar, no preauricular, no posterior auricular and no occipital adenopathy present.       Head (left side): No submental, no submandibular, no tonsillar, no preauricular, no posterior auricular and no occipital adenopathy present.    She has no cervical adenopathy.    She has no axillary adenopathy.  Neurological: She is alert and oriented to person, place, and time. She has normal strength. She displays normal reflexes. No cranial nerve deficit or sensory deficit. She exhibits normal muscle tone. She displays a negative Romberg sign. Coordination normal.  Skin: Skin is warm and dry. Capillary refill takes less than 2 seconds. No rash noted. She is not diaphoretic. No erythema. No pallor.  Psychiatric: She has a normal mood and affect. Her behavior is normal. Judgment and thought content normal.  Vitals reviewed.      Assessment:     Neck pain - Plan: DG Cervical Spine Complete, DG Shoulder Right, DG Chest 2 View  Acute pain of right shoulder - Plan: DG Shoulder Right, DG Chest 2 View  Follow up - Plan: DG Chest 2  View      Plan:     Meds ordered this encounter  Medications  . cyclobenzaprine (FLEXERIL) 10 MG tablet    Sig: Take 1 tablet (10 mg total) by mouth at bedtime. Try half of tablet before increasing to whole. Only take one.    Dispense:  15 tablet    Refill:  0  Discussed muscle relaxer and not to drive or operate machinery as will cause drowsiness. No making important/ legal decisions while on medication.     She reports she did not follow up on chest x ray from previous pneumonia - she is informed she still has standing order and follow up is recommended for resolution of pneumonia.   Patient declined Prednisone dose pack.  She will try Motrin 600 mg every eight hours x 7 to 10 days.  Avoid any strenuous movement or lifting anything over 5 lbs. May alternate heat and ice.   Orders Placed This Encounter  Procedures  . DG Cervical Spine Complete  . DG Shoulder Right  . DG Chest 2 View   Return to your Leotis Shames,  MD if any symptoms worsen for follow up. Return to this clinic in one week for follow up recheck and sooner if any symptoms change or worsen.   Provider thoroughly discussed in collaboration above plan with supervising physician Dr. Julieanne Manson who is in agreement with the care plan as above.   Advised patient call the office or your primary care doctor for an appointment if no improvement within 72 hours or if any symptoms change or worsen at any time  Advised ER or urgent Care if after hours or on weekend. Call 911 for emergency symptoms at any time.Patinet verbalized understanding of all instructions given/reviewed and treatment plan and has no further questions or concerns at this time.    Patient verbalized understanding of all instructions given and denies any further questions at this time.

## 2018-06-10 ENCOUNTER — Telehealth: Payer: Self-pay | Admitting: Medical

## 2018-06-10 NOTE — Telephone Encounter (Signed)
Patient informed of x-rays , she called for results. She is to have a one week follow up with Marvell FullerMichelle Flinchum NP. Patient scheduled for Thursday 06/12/18 follow up.

## 2018-06-11 NOTE — Progress Notes (Signed)
Negative shoulder x ray

## 2018-06-12 ENCOUNTER — Ambulatory Visit: Payer: Self-pay | Admitting: Adult Health

## 2018-06-12 ENCOUNTER — Encounter: Payer: Self-pay | Admitting: Adult Health

## 2018-06-12 VITALS — BP 134/64 | HR 93 | Temp 99.0°F | Resp 16 | Ht 62.0 in | Wt 146.0 lb

## 2018-06-12 DIAGNOSIS — S161XXS Strain of muscle, fascia and tendon at neck level, sequela: Secondary | ICD-10-CM

## 2018-06-12 DIAGNOSIS — R9389 Abnormal findings on diagnostic imaging of other specified body structures: Secondary | ICD-10-CM

## 2018-06-12 DIAGNOSIS — M47812 Spondylosis without myelopathy or radiculopathy, cervical region: Secondary | ICD-10-CM

## 2018-06-12 NOTE — Progress Notes (Addendum)
Subjective:     Patient ID: Kristine KrebsMelody K Flynn, female   DOB: Feb 01, 1971, 47 y.o.   MRN: [id number]  Blood pressure 134/64, pulse 93, temperature 99 F (37.2 C), resp. rate 16, height 5\' 2"  (1.575 m), weight 146 lb (66.2 kg), last menstrual period 05/22/2018, SpO2 98 %.  Patient's last menstrual period was 05/22/2018.  HPI  Patient is a 47 year old female in no acute distress who comes back for muscle strain, 400 mg Motrin am and 400 mg at pm and is helping some. She did noticed worsened pain with vacuuming over the weekend that has now improved.  Denies any radiating pain today.  She reports her right thumb pain and shoulder pain have improved after increased Motrin dosage as above.   She has not taken Flexeril at all.  She denies any numbness or tingling today. She has no limited range of motion. She reports she has been able to work. 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 She is waiting to hear back from them she reports this has been x 1 week she has been waiting and she will check back with them if no call next week.   Patient  denies any fever, body aches,chills, rash, chest pain, shortness of breath, nausea, vomiting, or diarrhea.      Review of Systems  Constitutional: Negative for activity change, appetite change, chills, diaphoresis, fatigue, fever and unexpected weight change.  HENT: Negative.  Negative for trouble swallowing.   Eyes: Negative for photophobia.  Respiratory: Negative.   Cardiovascular: Negative.  Negative for chest pain, palpitations and leg swelling.  Gastrointestinal: Negative.   Endocrine: Negative.   Genitourinary: Negative.   Musculoskeletal: Positive for neck stiffness (improved with Motrin since last visit - described as " mild uncomfortable". ). Negative for arthralgias, back pain, gait problem, joint swelling, myalgias and neck pain (improved since last visit ).      Right shoulder pain   Skin: Negative.   Neurological: Negative for dizziness, tremors, seizures, syncope, facial asymmetry, speech difficulty, weakness, light-headedness, numbness (resolved since last viist - mild right thumb intermittent - rare ) and headaches.  Hematological: Negative.   Psychiatric/Behavioral: Negative.        Objective:   Physical Exam  Constitutional: She is oriented to person, place, and time. She appears well-developed and well-nourished.  Patient is alert and oriented and responsive to questions Engages in eye contact with provider. Speaks in full sentences without any pauses without any shortness of breath or distress.    HENT:  Head: Normocephalic and atraumatic.  Nose: Nose normal.  Mouth/Throat: Oropharynx is clear and moist. No oropharyngeal exudate.  Eyes: Pupils are equal, round, and reactive to light. Conjunctivae and EOM are normal.  Neck: Normal range of motion. Neck supple.  Cardiovascular: Normal rate, regular rhythm, normal heart sounds and intact distal pulses. Exam reveals no gallop and no friction rub.  No murmur heard. Pulmonary/Chest: Effort normal and breath sounds normal. No stridor. No respiratory distress. She has no wheezes. She has no rales. She exhibits no tenderness.  Abdominal: Soft.  Musculoskeletal: Normal range of motion. She exhibits no edema, tenderness or deformity.  Neurological: She is alert and oriented to person, place, and time.  Patient moves on and off of exam table and in room without difficulty. Gait is normal in hall and in room. Patient is oriented to person place time and  situation. Patient answers questions appropriately and engages in conversation.   Skin: Skin is warm and dry. Capillary refill takes less than 2 seconds.  Psychiatric: She has a normal mood and affect. Her behavior is normal. Judgment and thought content normal.  Vitals reviewed.      Assessment:    Cervical strain, acute, sequela  Abnormal  CXR (chest x-ray)  Osteoarthritis of cervical spine, unspecified spinal osteoarthritis complication status    Plan:     She declined prednisone.  She will start Motrin 600 mg q 8 hours PRN  and will pick up the Flexeril she did not pick up at last visit and will try as prescribed.      Patient given copy of chest x ray - mild peribronchial thickening discussed and need to follow up with PCP Leotis ShamesSingh, Jasmine, MD  Within 1- 2 weeks. Also follow up with Leotis ShamesSingh, Jasmine, MD  For abnormal cervical spineIMPRESSION:No acute findings or significant malalignment. Minimal spondylosis at C5-6.  - she is aware she need to follow up with her Leotis ShamesSingh, Jasmine, MD  Within 1-2 weeks for further recommendations. Dr. Sullivan LoneGilbert is aware and this was his recommendation.  Patient is aware if she needs CT or MRI at primary care physicians discretion [facility].Patinet was given copy of cervical x ray as well as normal right shoulder x ray.   She is aware she should return to the office if any symptoms change or worsen and needs follow up for all above with her primary care within 1-2 weeks.   Advised patient call the office or your primary care doctor for an appointment if no improvement within 72 hours or if any symptoms change or worsen at any time  Advised ER or urgent Care if after hours or on weekend. Call 911 for emergency symptoms at any time.Patinet verbalized understanding of all instructions given/reviewed and treatment plan and has no further questions or concerns at this time.    Patient verbalized understanding of all instructions given and denies any further questions at this time.

## 2018-06-12 NOTE — Patient Instructions (Signed)
Cervical Sprain A cervical sprain is a stretch or tear in the tissues that connect bones (ligaments) in the neck. Most neck (cervical) sprains get better in 4-6 weeks. Follow these instructions at home: If you have a neck collar:  Wear it as told by your doctor. Do not take off (do not remove) the collar unless your doctor says that this is safe.  Ask your doctor before adjusting your collar.  If you have long hair, keep it outside of the collar.  Ask your doctor if you may take off the collar for cleaning and bathing. If you may take off the collar:  Follow instructions from your doctor about how to take off the collar safely.  Clean the collar by wiping it with mild soap and water. Let it air-dry all the way.  If your collar has removable pads:  Take the pads out every 1-2 days.  Hand wash the pads with soap and water.  Let the pads air-dry all the way before you put them back in the collar. Do not dry them in a clothes dryer. Do not dry them with a hair dryer.  Check your skin under the collar for irritation or sores. If you see any, tell your doctor. Managing pain, stiffness, and swelling  Use a cervical traction device, if told by your doctor.  If told, put heat on the affected area. Do this before exercises (physical therapy) or as often as told by your doctor. Use the heat source that your doctor recommends, such as a moist heat pack or a heating pad.  Place a towel between your skin and the heat source.  Leave the heat on for 20-30 minutes.  Take the heat off (remove the heat) if your skin turns bright red. This is very important if you cannot feel pain, heat, or cold. You may have a greater risk of getting burned.  Put ice on the affected area.  Put ice in a plastic bag.  Place a towel between your skin and the bag.  Leave the ice on for 20 minutes, 2-3 times a day. Activity  Do not drive while wearing a neck collar. If you do not have a neck collar, ask your  doctor if it is safe to drive.  Do not drive or use heavy machinery while taking prescription pain medicine or muscle relaxants, unless your doctor approves.  Do not lift anything that is heavier than 10 lb (4.5 kg) until your doctor tells you that it is safe.  Rest as told by your doctor.  Avoid activities that make you feel worse. Ask your doctor what activities are safe for you.  Do exercises as told by your doctor or physical therapist. Preventing neck sprain  Practice good posture. Adjust your workstation to help with this, if needed.  Exercise regularly as told by your doctor or physical therapist.  Avoid activities that are risky or may cause a neck sprain (cervical sprain). General instructions  Take over-the-counter and prescription medicines only as told by your doctor.  Do not use any products that contain nicotine or tobacco. This includes cigarettes and e-cigarettes. If you need help quitting, ask your doctor.  Keep all follow-up visits as told by your doctor. This is important. Contact a doctor if:  You have pain or other symptoms that get worse.  You have symptoms that do not get better after 2 weeks.  You have pain that does not get better with medicine.  You start to have new,   unexplained symptoms.  You have sores or irritated skin from wearing your neck collar. Get help right away if:  You have very bad pain.  You have any of the following in any part of your body:  Loss of feeling (numbness).  Tingling.  Weakness.  You cannot move a part of your body (you have paralysis).  Your activity level does not improve. Summary  A cervical sprain is a stretch or tear in the tissues that connect bones (ligaments) in the neck.  If you have a neck (cervical) collar, do not take off the collar unless your doctor says that this is safe.  Put ice on affected areas as told by your doctor.  Put heat on affected areas as told by your doctor.  Good posture  and regular exercise can help prevent a neck sprain from happening again. This information is not intended to replace advice given to you by your health care provider. Make sure you discuss any questions you have with your health care provider. Document Released: 04/02/2008 Document Revised: 06/26/2016 Document Reviewed: 06/26/2016 [employer] Interactive Patient Education  2017 [employer]  

## 2018-07-08 ENCOUNTER — Other Ambulatory Visit: Payer: Self-pay | Admitting: Internal Medicine

## 2018-07-08 DIAGNOSIS — Z1231 Encounter for screening mammogram for malignant neoplasm of breast: Secondary | ICD-10-CM

## 2018-08-15 ENCOUNTER — Ambulatory Visit
Admission: RE | Admit: 2018-08-15 | Discharge: 2018-08-15 | Disposition: A | Discharge status: Home / Self Care | Payer: BLUE CROSS/BLUE SHIELD | Source: Ambulatory Visit | Attending: Internal Medicine | Admitting: Internal Medicine

## 2018-08-15 DIAGNOSIS — Z1231 Encounter for screening mammogram for malignant neoplasm of breast: Secondary | ICD-10-CM | POA: Insufficient documentation

## 2018-09-09 ENCOUNTER — Other Ambulatory Visit: Payer: Self-pay

## 2018-11-11 ENCOUNTER — Other Ambulatory Visit: Payer: Self-pay

## 2018-11-11 DIAGNOSIS — Z0000 Encounter for general adult medical examination without abnormal findings: Secondary | ICD-10-CM

## 2018-11-12 ENCOUNTER — Other Ambulatory Visit: Payer: Self-pay

## 2018-11-12 DIAGNOSIS — Z0000 Encounter for general adult medical examination without abnormal findings: Secondary | ICD-10-CM

## 2018-11-13 LAB — LIPID PANEL
CHOL/HDL RATIO: 5.8 ratio — AB (ref 0.0–4.4)
Cholesterol, Total: 243 mg/dL — ABNORMAL HIGH (ref 100–199)
HDL: 42 mg/dL (ref >39)
LDL CALC: 153 mg/dL — AB (ref 0–99)
Triglycerides: 240 mg/dL — ABNORMAL HIGH (ref 0–149)
VLDL Cholesterol Cal: 48 mg/dL — ABNORMAL HIGH (ref 5–40)

## 2018-11-20 ENCOUNTER — Ambulatory Visit: Payer: Self-pay | Admitting: Medical

## 2018-11-20 ENCOUNTER — Encounter: Payer: Self-pay | Admitting: Medical

## 2018-11-20 VITALS — BP 118/44 | HR 100 | Temp 99.4°F | Resp 16 | Ht 60.0 in | Wt 146.0 lb

## 2018-11-20 DIAGNOSIS — R05 Cough: Secondary | ICD-10-CM

## 2018-11-20 DIAGNOSIS — H6502 Acute serous otitis media, left ear: Secondary | ICD-10-CM

## 2018-11-20 DIAGNOSIS — R059 Cough, unspecified: Secondary | ICD-10-CM

## 2018-11-20 DIAGNOSIS — R509 Fever, unspecified: Secondary | ICD-10-CM

## 2018-11-20 DIAGNOSIS — J0100 Acute maxillary sinusitis, unspecified: Secondary | ICD-10-CM

## 2018-11-20 LAB — POCT INFLUENZA A/B
INFLUENZA A, POC: POSITIVE — AB
INFLUENZA B, POC: NEGATIVE

## 2018-11-20 MED ORDER — BENZONATATE 100 MG PO CAPS: 100.0000 mg | ORAL_CAPSULE | Freq: Three times a day (TID) | ORAL | 0 refills | Status: DC | PRN

## 2018-11-20 MED ORDER — DOXYCYCLINE HYCLATE 100 MG PO TABS: 100.0000 mg | ORAL_TABLET | Freq: Two times a day (BID) | ORAL | 0 refills | Status: DC

## 2018-11-20 NOTE — Patient Instructions (Addendum)
Otitis Media, Adult  Otitis media means that the middle ear is red and swollen (inflamed) and full of fluid. The condition usually goes away on its own. Follow these instructions at home:  Take over-the-counter and prescription medicines only as told by your doctor.  If you were prescribed an antibiotic medicine, take it as told by your doctor. Do not stop taking the antibiotic even if you start to feel better.  Keep all follow-up visits as told by your doctor. This is important. Contact a doctor if:  You have bleeding from your nose.  There is a lump on your neck.  You are not getting better in 5 days.  You feel worse instead of better. Get help right away if:  You have pain that is not helped with medicine.  You have swelling, redness, or pain around your ear.  You get a stiff neck.  You cannot move part of your face (paralyzed).  You notice that the bone behind your ear hurts when you touch it.  You get a very bad headache. Summary  Otitis media means that the middle ear is red, swollen, and full of fluid.  This condition usually goes away on its own. In some cases, treatment may be needed.  If you were prescribed an antibiotic medicine, take it as told by your doctor. This information is not intended to replace advice given to you by your health care provider. Make sure you discuss any questions you have with your health care provider. Document Released: 04/02/2008 Document Revised: 11/05/2016 Document Reviewed: 11/05/2016 Elsevier Interactive Patient Education  2019 [employer] Sinusitis, Adult Sinusitis is soreness and swelling (inflammation) of your sinuses. Sinuses are hollow spaces in the bones around your face. They are located:  Around your eyes.  In the middle of your forehead.  Behind your nose.  In your cheekbones. Your sinuses and nasal passages are lined with a fluid called mucus. Mucus drains out of your sinuses. Swelling can trap mucus in your  sinuses. This lets germs (bacteria, virus, or fungus) grow, which leads to infection. Most of the time, this condition is caused by a virus. What are the causes? This condition is caused by:  Allergies.  Asthma.  Germs.  Things that block your nose or sinuses.  Growths in the nose (nasal polyps).  Chemicals or irritants in the air.  Fungus (rare). What increases the risk? You are more likely to develop this condition if:  You have a weak body defense system (immune system).  You do a lot of swimming or diving.  You use nasal sprays too much.  You smoke. What are the signs or symptoms? The main symptoms of this condition are pain and a feeling of pressure around the sinuses. Other symptoms include:  Stuffy nose (congestion).  Runny nose (drainage).  Swelling and warmth in the sinuses.  Headache.  Toothache.  A cough that may get worse at night.  Mucus that collects in the throat or the back of the nose (postnasal drip).  Being unable to smell and taste.  Being very tired (fatigue).  A fever.  Sore throat.  Bad breath. How is this diagnosed? This condition is diagnosed based on:  Your symptoms.  Your medical history.  A physical exam.  Tests to find out if your condition is short-term (acute) or long-term (chronic). Your doctor may: ? Check your nose for growths (polyps). ? Check your sinuses using a tool that has a light (endoscope). ? Check for allergies or   germs. ? Do imaging tests, such as an MRI or CT scan. How is this treated? Treatment for this condition depends on the cause and whether it is short-term or long-term.  If caused by a virus, your symptoms should go away on their own within 10 days. You may be given medicines to relieve symptoms. They include: ? Medicines that shrink swollen tissue in the nose. ? Medicines that treat allergies (antihistamines). ? A spray that treats swelling of the nostrils. ? Rinses that help get rid of  thick mucus in your nose (nasal saline washes).  If caused by bacteria, your doctor may wait to see if you will get better without treatment. You may be given antibiotic medicine if you have: ? A very bad infection. ? A weak body defense system.  If caused by growths in the nose, you may need to have surgery. Follow these instructions at home: Medicines  Take, use, or apply over-the-counter and prescription medicines only as told by your doctor. These may include nasal sprays.  If you were prescribed an antibiotic medicine, take it as told by your doctor. Do not stop taking the antibiotic even if you start to feel better. Hydrate and humidify   Drink enough water to keep your pee (urine) pale yellow.  Use a cool mist humidifier to keep the humidity level in your home above 50%.  Breathe in steam for 10-15 minutes, 3-4 times a day, or as told by your doctor. You can do this in the bathroom while a hot shower is running.  Try not to spend time in cool or dry air. Rest  Rest as much as you can.  Sleep with your head raised (elevated).  Make sure you get enough sleep each night. General instructions   Put a warm, moist washcloth on your face 3-4 times a day, or as often as told by your doctor. This will help with discomfort.  Wash your hands often with soap and water. If there is no soap and water, use hand sanitizer.  Do not smoke. Avoid being around people who are smoking (secondhand smoke).  Keep all follow-up visits as told by your doctor. This is important. Contact a doctor if:  You have a fever.  Your symptoms get worse.  Your symptoms do not get better within 10 days. Get help right away if:  You have a very bad headache.  You cannot stop throwing up (vomiting).  You have very bad pain or swelling around your face or eyes.  You have trouble seeing.  You feel confused.  Your neck is stiff.  You have trouble breathing. Summary  Sinusitis is swelling of  your sinuses. Sinuses are hollow spaces in the bones around your face.  This condition is caused by tissues in your nose that become inflamed or swollen. This traps germs. These can lead to infection.  If you were prescribed an antibiotic medicine, take it as told by your doctor. Do not stop taking it even if you start to feel better.  Keep all follow-up visits as told by your doctor. This is important. This information is not intended to replace advice given to you by your health care provider. Make sure you discuss any questions you have with your health care provider. Document Released: 04/02/2008 Document Revised: 03/17/2018 Document Reviewed: 03/17/2018 Elsevier Interactive Patient Education  2019 [employer] Influenza, Adult Influenza is also called "the flu." It is an infection in the lungs, nose, and throat (respiratory tract). It is caused  by a virus. The flu causes symptoms that are similar to symptoms of a cold. It also causes a high fever and body aches. The flu spreads easily from person to person (is contagious). Getting a flu shot (influenza vaccination) every year is the best way to prevent the flu. What are the causes? This condition is caused by the influenza virus. You can get the virus by:  Breathing in droplets that are in the air from the cough or sneeze of a person who has the virus.  Touching something that has the virus on it (is contaminated) and then touching your mouth, nose, or eyes. What increases the risk? Certain things may make you more likely to get the flu. These include:  Not washing your hands often.  Having close contact with many people during cold and flu season.  Touching your mouth, eyes, or nose without first washing your hands.  Not getting a flu shot every year. You may have a higher risk for the flu, along with serious problems such as a lung infection (pneumonia), if you:  Are older than 65.  Are pregnant.  Have a weakened  disease-fighting system (immune system) because of a disease or taking certain medicines.  Have a long-term (chronic) illness, such as: ? Heart, kidney, or lung disease. ? Diabetes. ? Asthma.  Have a liver disorder.  Are very overweight (morbidly obese).  Have anemia. This is a condition that affects your red blood cells. What are the signs or symptoms? Symptoms usually begin suddenly and last 4-14 days. They may include:  Fever and chills.  Headaches, body aches, or muscle aches.  Sore throat.  Cough.  Runny or stuffy (congested) nose.  Chest discomfort.  Not wanting to eat as much as normal (poor appetite).  Weakness or feeling tired (fatigue).  Dizziness.  Feeling sick to your stomach (nauseous) or throwing up (vomiting). How is this treated? If the flu is found early, you can be treated with medicine that can help reduce how bad the illness is and how long it lasts (antiviral medicine). This may be given by mouth (orally) or through an IV tube. Taking care of yourself at home can help your symptoms get better. Your doctor may suggest:  Taking over-the-counter medicines.  Drinking plenty of fluids. The flu often goes away on its own. If you have very bad symptoms or other problems, you may be treated in a hospital. Follow these instructions at home:     Activity  Rest as needed. Get plenty of sleep.  Stay home from work or school as told by your doctor. ? Do not leave home until you do not have a fever for 24 hours without taking medicine. ? Leave home only to visit your doctor. Eating and drinking  Take an ORS (oral rehydration solution). This is a drink that is sold at pharmacies and stores.  Drink enough fluid to keep your pee (urine) pale yellow.  Drink clear fluids in small amounts as you are able. Clear fluids include: ? Water. ? Ice chips. ? Fruit juice that has water added (diluted fruit juice). ? Low-calorie sports drinks.  Eat bland,  easy-to-digest foods in small amounts as you are able. These foods include: ? Bananas. ? Applesauce. ? Rice. ? Lean meats. ? Toast. ? Crackers.  Do not eat or drink: ? Fluids that have a lot of sugar or caffeine. ? Alcohol. ? Spicy or fatty foods. General instructions  Take over-the-counter and prescription medicines only as told  by your doctor.  Use a cool mist humidifier to add moisture to the air in your home. This can make it easier for you to breathe.  Cover your mouth and nose when you cough or sneeze.  Wash your hands with soap and water often, especially after you cough or sneeze. If you cannot use soap and water, use alcohol-based hand sanitizer.  Keep all follow-up visits as told by your doctor. This is important. How is this prevented?   Get a flu shot every year. You may get the flu shot in late summer, fall, or winter. Ask your doctor when you should get your flu shot.  Avoid contact with people who are sick during fall and winter (cold and flu season). Contact a doctor if:  You get new symptoms.  You have: ? Chest pain. ? Watery poop (diarrhea). ? A fever.  Your cough gets worse.  You start to have more mucus.  You feel sick to your stomach.  You throw up. Get help right away if you:  Have shortness of breath.  Have trouble breathing.  Have skin or nails that turn a bluish color.  Have very bad pain or stiffness in your neck.  Get a sudden headache.  Get sudden pain in your face or ear.  Cannot eat or drink without throwing up. Summary  Influenza ("the flu") is an infection in the lungs, nose, and throat. It is caused by a virus.  Take over-the-counter and prescription medicines only as told by your doctor.  Getting a flu shot every year is the best way to avoid getting the flu. This information is not intended to replace advice given to you by your health care provider. Make sure you discuss any questions you have with your health care  provider. Document Released: 07/24/2008 Document Revised: 04/02/2018 Document Reviewed: 04/02/2018 Elsevier Interactive Patient Education  2019 [employer]  Influenza, Adult Influenza is also called "the flu." It is an infection in the lungs, nose, and throat (respiratory tract). It is caused by a virus. The flu causes symptoms that are similar to symptoms of a cold. It also causes a high fever and body aches. The flu spreads easily from person to person (is contagious). Getting a flu shot (influenza vaccination) every year is the best way to prevent the flu. What are the causes? This condition is caused by the influenza virus. You can get the virus by:  Breathing in droplets that are in the air from the cough or sneeze of a person who has the virus.  Touching something that has the virus on it (is contaminated) and then touching your mouth, nose, or eyes. What increases the risk? Certain things may make you more likely to get the flu. These include:  Not washing your hands often.  Having close contact with many people during cold and flu season.  Touching your mouth, eyes, or nose without first washing your hands.  Not getting a flu shot every year. You may have a higher risk for the flu, along with serious problems such as a lung infection (pneumonia), if you:  Are older than 65.  Are pregnant.  Have a weakened disease-fighting system (immune system) because of a disease or taking certain medicines.  Have a long-term (chronic) illness, such as: ? Heart, kidney, or lung disease. ? Diabetes. ? Asthma.  Have a liver disorder.  Are very overweight (morbidly obese).  Have anemia. This is a condition that affects your red blood cells. What are  the signs or symptoms? Symptoms usually begin suddenly and last 4-14 days. They may include:  Fever and chills.  Headaches, body aches, or muscle aches.  Sore throat.  Cough.  Runny or stuffy (congested) nose.  Chest  discomfort.  Not wanting to eat as much as normal (poor appetite).  Weakness or feeling tired (fatigue).  Dizziness.  Feeling sick to your stomach (nauseous) or throwing up (vomiting). How is this treated? If the flu is found early, you can be treated with medicine that can help reduce how bad the illness is and how long it lasts (antiviral medicine). This may be given by mouth (orally) or through an IV tube. Taking care of yourself at home can help your symptoms get better. Your doctor may suggest:  Taking over-the-counter medicines.  Drinking plenty of fluids. The flu often goes away on its own. If you have very bad symptoms or other problems, you may be treated in a hospital. Follow these instructions at home:     Activity  Rest as needed. Get plenty of sleep.  Stay home from work or school as told by your doctor. ? Do not leave home until you do not have a fever for 24 hours without taking medicine. ? Leave home only to visit your doctor. Eating and drinking  Take an ORS (oral rehydration solution). This is a drink that is sold at pharmacies and stores.  Drink enough fluid to keep your pee (urine) pale yellow.  Drink clear fluids in small amounts as you are able. Clear fluids include: ? Water. ? Ice chips. ? Fruit juice that has water added (diluted fruit juice). ? Low-calorie sports drinks.  Eat bland, easy-to-digest foods in small amounts as you are able. These foods include: ? Bananas. ? Applesauce. ? Rice. ? Lean meats. ? Toast. ? Crackers.  Do not eat or drink: ? Fluids that have a lot of sugar or caffeine. ? Alcohol. ? Spicy or fatty foods. General instructions  Take over-the-counter and prescription medicines only as told by your doctor.  Use a cool mist humidifier to add moisture to the air in your home. This can make it easier for you to breathe.  Cover your mouth and nose when you cough or sneeze.  Wash your hands with soap and water often,  especially after you cough or sneeze. If you cannot use soap and water, use alcohol-based hand sanitizer.  Keep all follow-up visits as told by your doctor. This is important. How is this prevented?   Get a flu shot every year. You may get the flu shot in late summer, fall, or winter. Ask your doctor when you should get your flu shot.  Avoid contact with people who are sick during fall and winter (cold and flu season). Contact a doctor if:  You get new symptoms.  You have: ? Chest pain. ? Watery poop (diarrhea). ? A fever.  Your cough gets worse.  You start to have more mucus.  You feel sick to your stomach.  You throw up. Get help right away if you:  Have shortness of breath.  Have trouble breathing.  Have skin or nails that turn a bluish color.  Have very bad pain or stiffness in your neck.  Get a sudden headache.  Get sudden pain in your face or ear.  Cannot eat or drink without throwing up. Summary  Influenza ("the flu") is an infection in the lungs, nose, and throat. It is caused by a virus.  Take over-the-counter  and prescription medicines only as told by your doctor.  Getting a flu shot every year is the best way to avoid getting the flu. This information is not intended to replace advice given to you by your health care provider. Make sure you discuss any questions you have with your health care provider. Document Released: 07/24/2008 Document Revised: 04/02/2018 Document Reviewed: 04/02/2018 Elsevier Interactive Patient Education  2019 [employer].

## 2018-11-20 NOTE — Progress Notes (Signed)
Subjective:    Patient ID: Kristine KrebsMelody K Schooler, female    DOB: 11/13/1970, 48 y.o.   MRN: [id number]  HPI 48 yo female in non acute distress.Comes in today with complaints starting Tuesday  Mild sore thraot worse on Wednesday and then cough temp 101. 5. , yesterday productive green cough. Took dose of Ibupofen liquid.Felt like fever returned in there evening took temp at 10:15 pm on Wednesday temp of  103. 8 , chills, soreness from coughing.  No know exposure to the flu.. Denies shortness of breath or wheezing , does have chest soreness from coughing.  Flew back from [place] in Monday.Drinking hot tea in room.  Blood pressure (!) 118/44, pulse 100, temperature 99.4 F (37.4 C), resp. rate 16, height 5' (1.524 m), weight 146 lb (66.2 kg), last menstrual period 11/06/2018, SpO2 98 %. Allergies  Allergen Reactions  . Augmentin [Amoxicillin-Pot Clavulanate] Rash   Review of Systems  Constitutional: Positive for activity change (feels to sick to work today), chills, fatigue and fever. Negative for appetite change.  HENT: Positive for ear pain (left), postnasal drip, sore throat and trouble swallowing (due to pain 8/10 ). Negative for congestion, ear discharge, rhinorrhea, sinus pressure, sinus pain and sneezing.   Eyes: Negative for discharge and itching.  Respiratory: Positive for cough. Negative for chest tightness, shortness of breath and wheezing.   Cardiovascular: Positive for chest pain (front of lower ribs "from coughing").  Gastrointestinal: Negative for abdominal pain, diarrhea, nausea and vomiting.  Endocrine: Negative for polydipsia, polyphagia and polyuria.  Genitourinary: Negative for dysuria.  Musculoskeletal: Positive for back pain (aching).  Skin: Negative for rash.  Allergic/Immunologic: Negative for environmental allergies and food allergies.  Neurological: Negative for dizziness, syncope, light-headedness and headaches.  Hematological: Negative for adenopathy.   Psychiatric/Behavioral: Negative for behavioral problems, confusion, hallucinations, self-injury and suicidal ideas.      Did get Flu vaccine. Objective:   Physical Exam Vitals signs and nursing note reviewed.  Constitutional:      Appearance: She is well-developed.  HENT:     Head: Normocephalic and atraumatic.     Jaw: There is normal jaw occlusion.     Right Ear: Hearing and ear canal normal. A middle ear effusion is present.     Left Ear: Hearing and ear canal normal. A middle ear effusion is present. Tympanic membrane is erythematous.     Nose: Mucosal edema, congestion and rhinorrhea present. Rhinorrhea is purulent.     Left Turbinates: Enlarged.     Mouth/Throat:     Lips: Pink.     Mouth: Mucous membranes are moist.     Pharynx: Uvula midline. Posterior oropharyngeal erythema (mild) present. No pharyngeal swelling or uvula swelling.     Tonsils: No tonsillar exudate or tonsillar abscesses. Swelling: 2+ on the right. 2+ on the left.  Eyes:     Pupils: Pupils are equal, round, and reactive to light.  Neck:     Musculoskeletal: Normal range of motion and neck supple.  Cardiovascular:     Rate and Rhythm: Normal rate and regular rhythm.     Heart sounds: Normal heart sounds.  Pulmonary:     Effort: Pulmonary effort is normal.     Breath sounds: Normal breath sounds.  Lymphadenopathy:     Cervical: Cervical adenopathy (left side) present.  Skin:    General: Skin is warm and dry.  Neurological:     General: No focal deficit present.     Mental Status: She is alert  and oriented to person, place, and time.  Psychiatric:        Mood and Affect: Mood normal.        Behavior: Behavior normal.   mild cough in room    Flu test A positive Assessment & Plan:  Influenza Sinusitis/ Otitis Media Left Meds ordered this encounter  Medications  . doxycycline (VIBRA-TABS) 100 MG tablet    Sig: Take 1 tablet (100 mg total) by mouth 2 (two) times daily.    Dispense:  20 tablet     Refill:  0  . benzonatate (TESSALON PERLES) 100 MG capsule    Sig: Take 1 capsule (100 mg total) by mouth 3 (three) times daily as needed.    Dispense:  30 capsule    Refill:  0  Rest and push fluids, gatorde if not eating well. Patient must be ferver free x 24 hours without Motrin or Tylenol before returning to work. Work note given to patient 1-23/2020 -11/23/2018. Return to the clinic if not improving in 3-5 days. Patient verbalizes understanding and has no questions at discharge.

## 2019-01-09 ENCOUNTER — Other Ambulatory Visit: Payer: Self-pay

## 2019-01-09 ENCOUNTER — Encounter: Payer: Self-pay | Admitting: Nurse Practitioner

## 2019-01-09 ENCOUNTER — Ambulatory Visit: Payer: Self-pay | Admitting: Nurse Practitioner

## 2019-01-09 VITALS — BP 131/59 | HR 87 | Temp 98.8°F | Resp 16

## 2019-01-09 DIAGNOSIS — B349 Viral infection, unspecified: Secondary | ICD-10-CM

## 2019-01-09 NOTE — Patient Instructions (Signed)
Continue ginger. Drink hot tea with honey and lemon Fluids, rest and continue hand washing Can continue ibuprofen as needed If you develop fever 101.5 or higher, severe cough, shortness of breath or worsening symptoms please go to urgent care or see your PCP; verbalized understanding

## 2019-01-09 NOTE — Progress Notes (Signed)
   Subjective:    Patient ID: Kristine Flynn, female    DOB: 1971-04-25, 48 y.o.   MRN: [id number]  HPI Ellie is here today with c/o fatigue x 3 days and dry cough started 2 days ago. Denies worsening symptoms and feels much better today. Has taken ginger, increased fluids and ibuprofen. Able to do routine activity such as walking dog, but has missed days of work. Denies SOB, fever, congestion, c/p, facial pain or ear pain.  Review of Systems  Constitutional: Positive for fatigue. Negative for fever.  HENT: Negative for congestion, ear pain, postnasal drip, sinus pressure, sinus pain and sore throat.   Respiratory: Positive for cough. Negative for shortness of breath and wheezing.        Objective:   Physical Exam Vitals signs reviewed.  Constitutional:      Appearance: She is well-developed.  HENT:     Head: Normocephalic and atraumatic.     Comments: No maxillary or frontal sinus tenderness    Right Ear: Ear canal normal.     Left Ear: Tympanic membrane normal.     Ears:     Comments: Unable to visualize left TM d/t cerumen    Nose:     Comments: Bilateral nares boggy and erythematous. Right nare with clear nasal discharge.    Mouth/Throat:     Comments: Cobblestoning and mildly injected Neck:     Musculoskeletal: Normal range of motion.  Cardiovascular:     Rate and Rhythm: Normal rate and regular rhythm.     Heart sounds: Normal heart sounds.  Pulmonary:     Effort: Pulmonary effort is normal. No respiratory distress.     Breath sounds: Normal breath sounds.  Abdominal:     General: Bowel sounds are normal.     Palpations: Abdomen is soft.  Musculoskeletal: Normal range of motion.  Lymphadenopathy:     Cervical: Cervical adenopathy present.  Skin:    General: Skin is warm and dry.  Neurological:     Mental Status: She is alert and oriented to person, place, and time.           Assessment & Plan:

## 2019-03-20 IMAGING — MG MM DIGITAL SCREENING BILAT W/ CAD
4 series · 4 of 4 positions shown · non-contrast
Comparison: Previous exam(s).

CLINICAL DATA: Screening.

EXAM:
DIGITAL SCREENING BILATERAL MAMMOGRAM WITH CAD

[R CC]
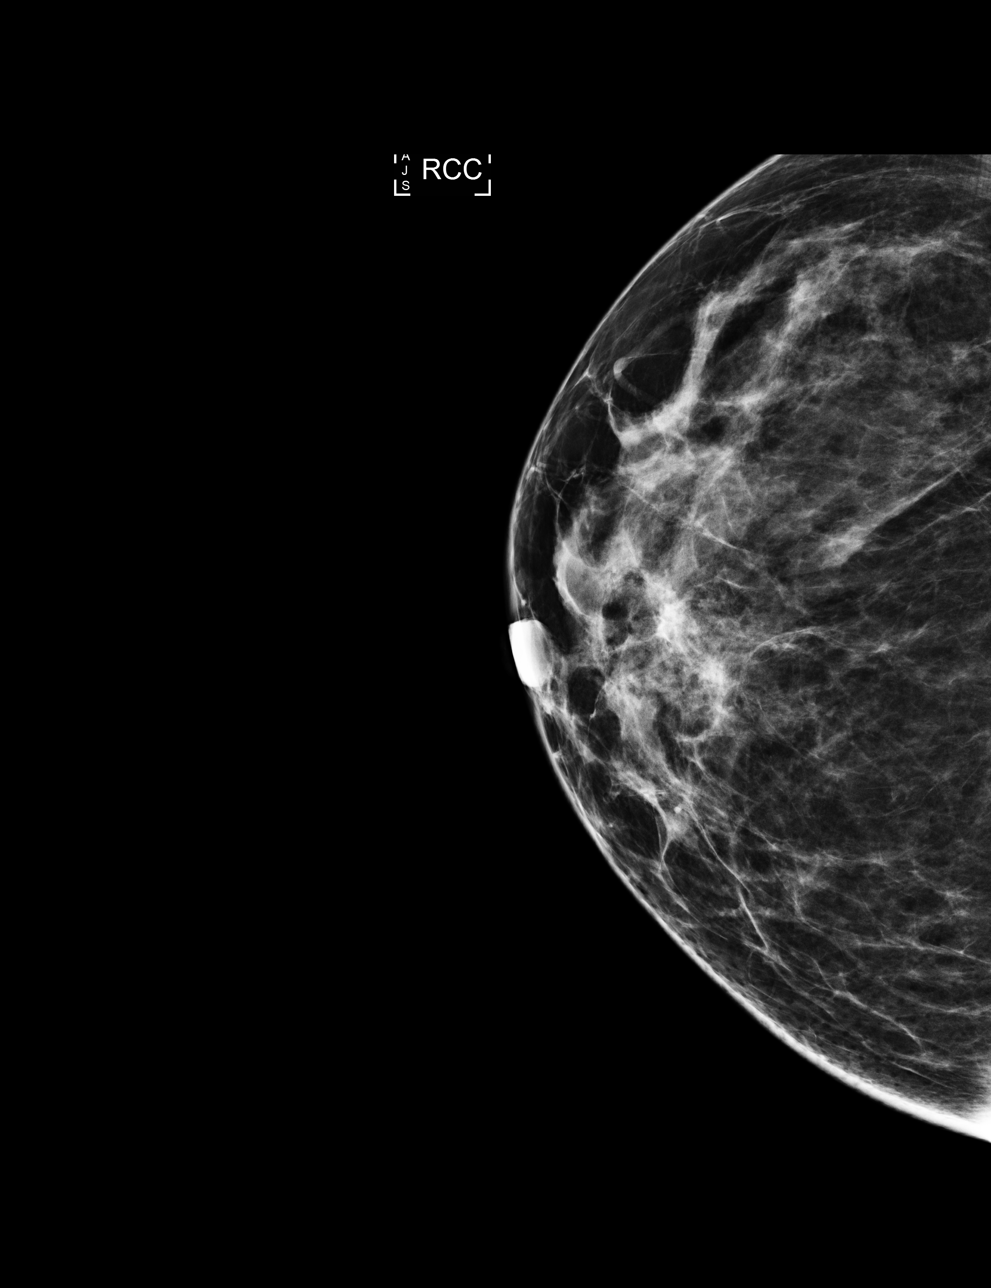

[L MLO]
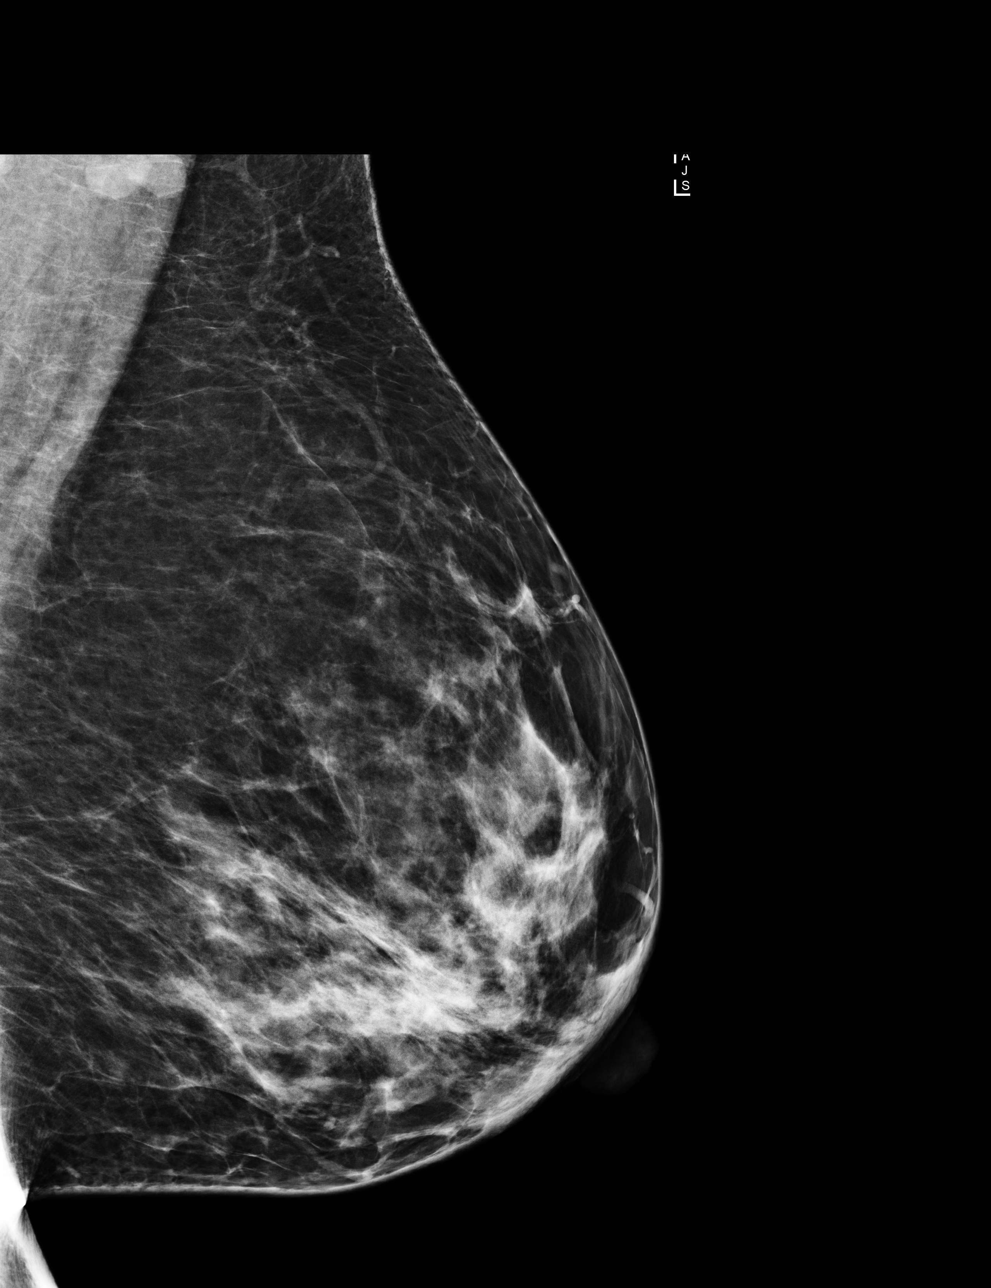

[L CC]
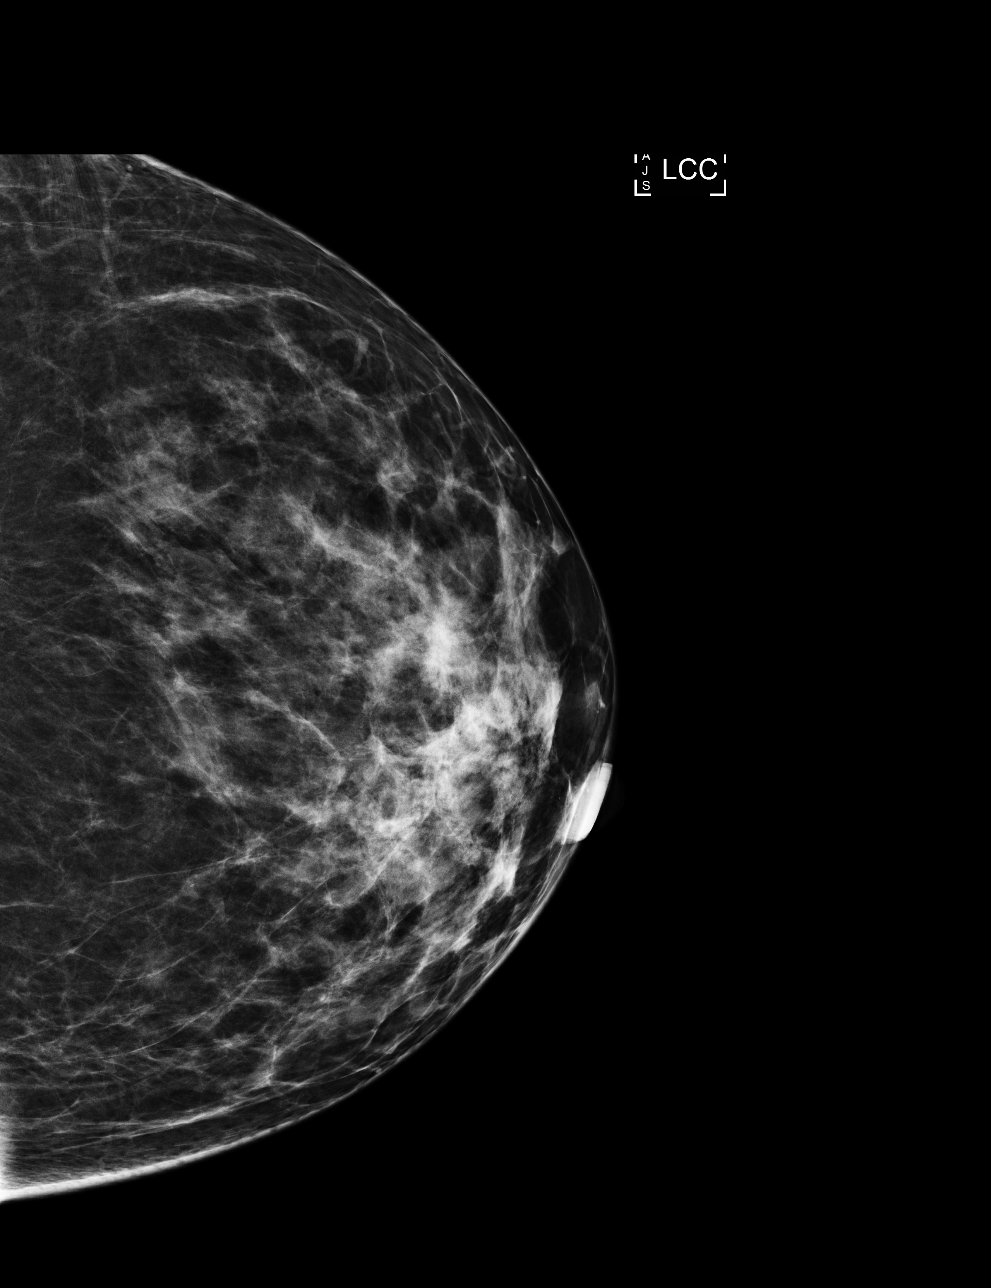

[R MLO]
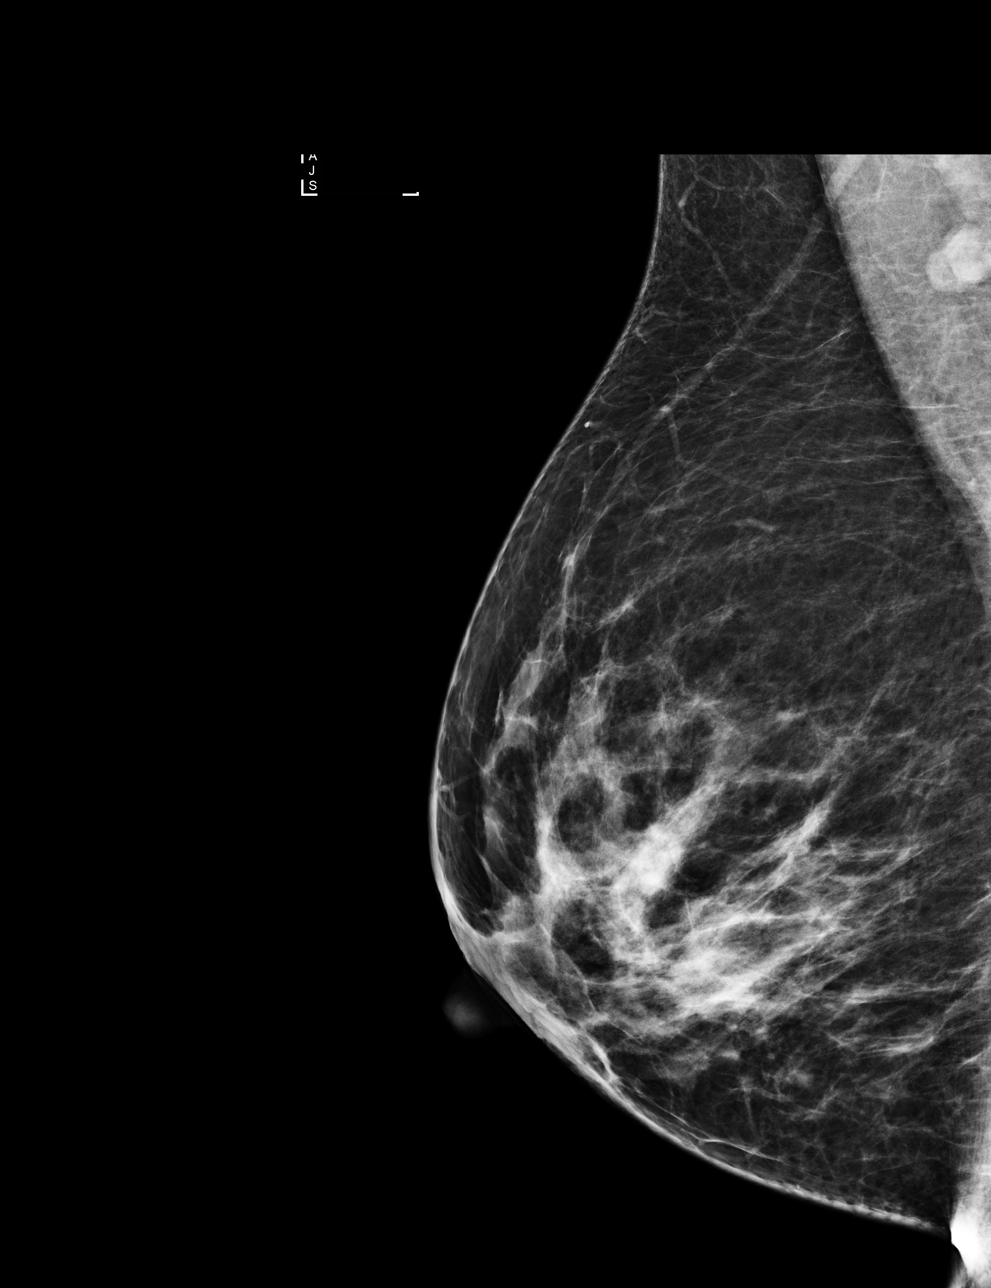

[4 of 4 positions shown; findings below may reference images not displayed]

ACR Breast Density Category c: The breast tissue is heterogeneously
dense, which may obscure small masses.
FINDINGS: There are no findings suspicious for malignancy. Images were
processed with CAD.
IMPRESSION: No mammographic evidence of malignancy. A result letter of this
screening mammogram will be mailed directly to the patient.

RECOMMENDATION:
Screening mammogram in one year. (Code:[id number])

BI-RADS CATEGORY  1: Negative.

## 2020-01-10 IMAGING — CR DG CHEST 2V
2 series · 2 of 2 positions shown · non-contrast
Comparison: Radiographs 01/17/2018.

CLINICAL DATA: Right-sided neck pain upon awakening 3 weeks ago.
Right shoulder pain with tingling in the right thumb. No acute
injury.

EXAM:
CHEST - 2 VIEW

[chest pa]
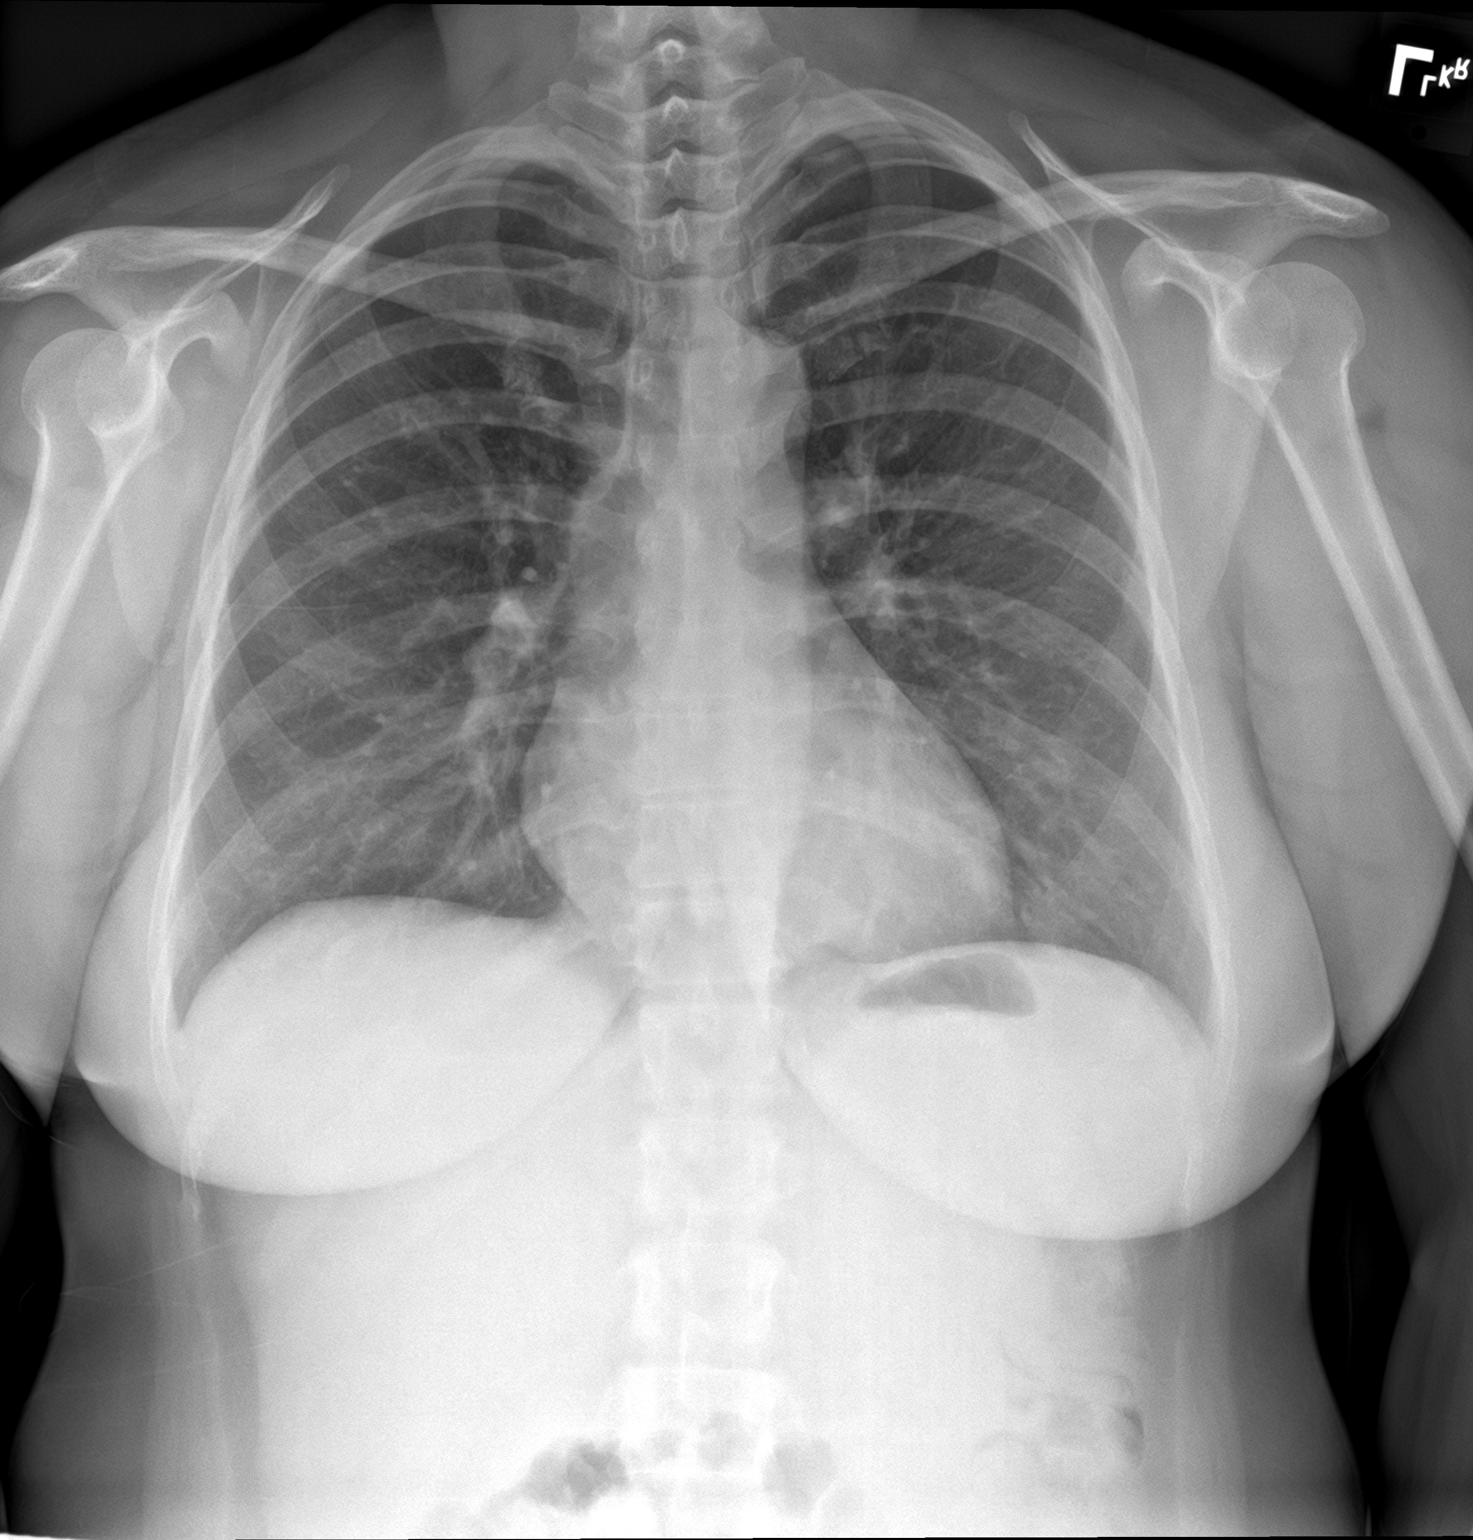

[chest lat]
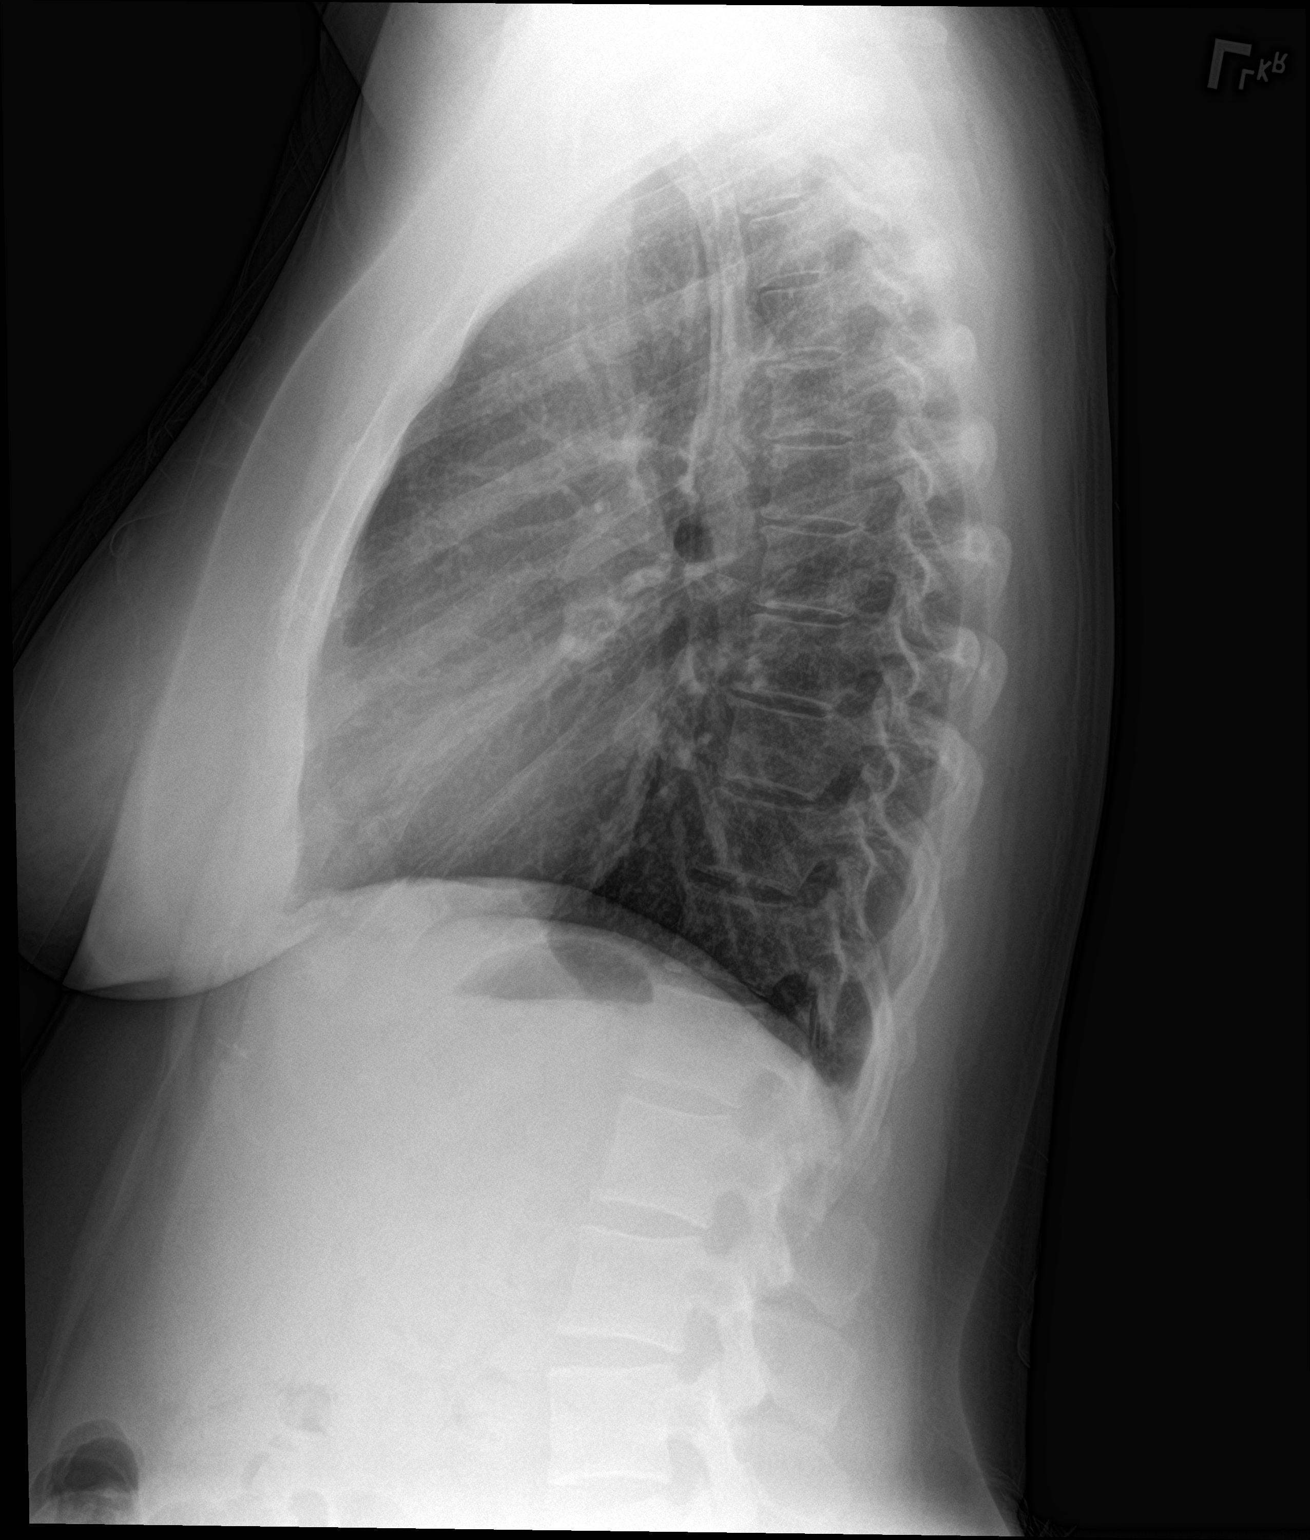

[2 of 2 positions shown; findings below may reference images not displayed]

FINDINGS: The heart size and mediastinal contours are stable. There is mild
central airway thickening without hyperinflation, confluent airspace
opacity, pleural effusion or pneumothorax. No osseous abnormalities
are identified.
IMPRESSION: No acute findings.  Mild central airway thickening.

## 2020-05-04 ENCOUNTER — Other Ambulatory Visit: Payer: Self-pay | Admitting: Internal Medicine

## 2020-05-04 DIAGNOSIS — Z1231 Encounter for screening mammogram for malignant neoplasm of breast: Secondary | ICD-10-CM

## 2020-05-09 ENCOUNTER — Ambulatory Visit
Admission: RE | Admit: 2020-05-09 | Discharge: 2020-05-09 | Disposition: A | Discharge status: Home / Self Care | Payer: BC Managed Care – PPO | Source: Ambulatory Visit | Attending: Internal Medicine | Admitting: Internal Medicine

## 2020-05-09 DIAGNOSIS — Z1231 Encounter for screening mammogram for malignant neoplasm of breast: Secondary | ICD-10-CM | POA: Diagnosis present

## 2021-01-12 ENCOUNTER — Other Ambulatory Visit: Payer: Self-pay

## 2021-01-12 ENCOUNTER — Ambulatory Visit: Payer: BC Managed Care – PPO | Admitting: Nurse Practitioner

## 2021-01-12 ENCOUNTER — Encounter: Payer: Self-pay | Admitting: Nurse Practitioner

## 2021-01-12 VITALS — BP 124/76 | HR 88 | Temp 99.5°F | Resp 16 | Ht 60.0 in | Wt 140.0 lb

## 2021-01-12 DIAGNOSIS — S0990XA Unspecified injury of head, initial encounter: Secondary | ICD-10-CM

## 2021-01-12 NOTE — Progress Notes (Signed)
   Subjective:    Patient ID: Kristine Flynn, female    DOB: November 22, 1970, 50 y.o.   MRN: [id number]  HPI  50 year old female presenting to clinic with complaints of hitting her head 01/08/2021 around midnight. She had a bump on the back of her head that she could feel initially is improving now. She has taken one Aleve since the time of injury. She has been able to work without issues.   She fell backwards onto the hardwood floor and was able to catch herself.   She just wanted to come in today to make sure "everything was OK"  Patient does not wish to talk about how injury occurred.   Today's Vitals   01/12/21 1010  BP: 124/76  Pulse: 88  Resp: 16  Temp: 99.5 F (37.5 C)  TempSrc: Tympanic  SpO2: 98%  Weight: 140 lb (63.5 kg)  Height: 5' (1.524 m)   Body mass index is 27.34 kg/m.   Review of Systems  Constitutional: Negative.   HENT: Negative.   Respiratory: Negative.   Cardiovascular: Negative.   Gastrointestinal: Negative.   Genitourinary: Negative.   Musculoskeletal: Negative.   Neurological: Negative.        Objective:   Physical Exam HENT:     Head: Normocephalic.  Eyes:     Extraocular Movements: Extraocular movements intact.     Pupils: Pupils are equal, round, and reactive to light.  Neurological:     General: No focal deficit present.     Mental Status: She is alert and oriented to person, place, and time.     Cranial Nerves: Cranial nerves are intact.     Sensory: No sensory deficit.     Motor: No weakness.     Coordination: Romberg sign negative. Coordination normal. Heel to Shin Test normal.     Gait: Gait is intact.     ACE score 0       Assessment & Plan:  Advised patient to avoid high risk activities for two weeks (contact sports) while recovering.   May use tylenol or ibuprofen for pain relief.   RTC with any new concerns  Advised EAP line if needed.
